# Patient Record
Sex: Female | Born: 1970
Health system: Southern US, Community
[De-identification: ages and names within clinical notes are randomized; demographics above are authoritative.]

## PROBLEM LIST (undated history)

## (undated) ENCOUNTER — Inpatient Hospital Stay (HOSPITAL_COMMUNITY): Payer: Self-pay

## (undated) DIAGNOSIS — J45909 Unspecified asthma, uncomplicated: Secondary | ICD-10-CM

## (undated) DIAGNOSIS — M1990 Unspecified osteoarthritis, unspecified site: Secondary | ICD-10-CM

## (undated) DIAGNOSIS — I10 Essential (primary) hypertension: Secondary | ICD-10-CM

## (undated) DIAGNOSIS — O24419 Gestational diabetes mellitus in pregnancy, unspecified control: Secondary | ICD-10-CM

## (undated) HISTORY — PX: FRACTURE SURGERY: SHX138

---

## 1995-04-07 HISTORY — PX: CHOLECYSTECTOMY, LAPAROSCOPIC: SHX56

## 1995-04-07 HISTORY — PX: ARTHROSCOPIC REPAIR PCL: SUR81

## 1999-12-31 ENCOUNTER — Other Ambulatory Visit: Admission: RE | Admit: 1999-12-31 | Discharge: 1999-12-31 | Payer: Self-pay | Admitting: Family Medicine

## 2001-02-21 ENCOUNTER — Other Ambulatory Visit: Admission: RE | Admit: 2001-02-21 | Discharge: 2001-02-21 | Payer: Self-pay | Admitting: Family Medicine

## 2002-08-11 ENCOUNTER — Other Ambulatory Visit: Admission: RE | Admit: 2002-08-11 | Discharge: 2002-08-11 | Payer: Self-pay | Admitting: Family Medicine

## 2003-09-28 ENCOUNTER — Other Ambulatory Visit: Admission: RE | Admit: 2003-09-28 | Discharge: 2003-09-28 | Payer: Self-pay | Admitting: Family Medicine

## 2004-09-29 ENCOUNTER — Other Ambulatory Visit: Admission: RE | Admit: 2004-09-29 | Discharge: 2004-09-29 | Payer: Self-pay | Admitting: Family Medicine

## 2006-01-15 ENCOUNTER — Other Ambulatory Visit: Admission: RE | Admit: 2006-01-15 | Discharge: 2006-01-15 | Payer: Self-pay | Admitting: Family Medicine

## 2007-01-24 ENCOUNTER — Other Ambulatory Visit: Admission: RE | Admit: 2007-01-24 | Discharge: 2007-01-24 | Payer: Self-pay | Admitting: Family Medicine

## 2007-07-21 ENCOUNTER — Emergency Department (HOSPITAL_COMMUNITY): Admission: EM | Admit: 2007-07-21 | Discharge: 2007-07-21 | Payer: Self-pay | Admitting: Emergency Medicine

## 2007-07-29 ENCOUNTER — Inpatient Hospital Stay (HOSPITAL_COMMUNITY): Admission: RE | Admit: 2007-07-29 | Discharge: 2007-07-30 | Payer: Self-pay | Admitting: Orthopaedic Surgery

## 2008-05-21 ENCOUNTER — Other Ambulatory Visit: Admission: RE | Admit: 2008-05-21 | Discharge: 2008-05-21 | Payer: Self-pay | Admitting: Family Medicine

## 2009-08-14 ENCOUNTER — Other Ambulatory Visit: Admission: RE | Admit: 2009-08-14 | Discharge: 2009-08-14 | Payer: Self-pay | Admitting: Family Medicine

## 2010-08-19 NOTE — Op Note (Signed)
Evelyn [name], Evelyn [name] NO.:  [id number]   MEDICAL RECORD NO.:  [id number]          PATIENT TYPE:  INP   LOCATION:  5034                         FACILITY:  [facility]   PHYSICIAN:  Vanita Panda. Magnus Ivan, M.D.DATE OF BIRTH:  06/22/1970   DATE OF PROCEDURE:  07/29/2007  DATE OF DISCHARGE:                               OPERATIVE REPORT   PREOPERATIVE DIAGNOSES:  1. Left comminuted humerus fracture.  2. Left radial nerve palsy.   POSTOPERATIVE DIAGNOSES:  1. Left comminuted humerus fracture.  2. Left radial nerve palsy.   PROCEDURE:  1. Open reduction and internal fixation of left comminuted humerus      fracture.  2. Left radial nerve exploration and neurolysis.   SURGEON:  Vanita Panda. Magnus Ivan, MD.   ASSISTANT:  Wende Neighbors, PA-C.   ANESTHESIA:  General.   ANTIBIOTICS:  2 g IV Ancef.   BLOOD LOSS:  300 mL.   COMPLICATIONS:  None.   INDICATIONS:  Briefly, Ms. Bartelt is an obese 40 year old female who  is very pleasant individual, who had a mechanical fall in her yard on  July 21, 2007, which was 8 days ago.  She sustained a comminuted  humerus fracture of the humeral shaft.  She was placed in coaptation  splint and was sent as followup to my office since I was on-call.  I did  see the x-rays at the time of her fall, being on-call and per report,  she was neurovascularly intact.  However, I summarized that the  physician on-call or the physician for the emergency room felt that she  was flexing and extending her fingers, but this was likely through the  lumbricals and not through the MCP joints showing a radial nerve injury.  When I examined her this week in the office, she could not extend her  wrist, thumb, or her fingers and she has had numbness in her hand and  definitely a radial nerve injury.  She had a comminuted humerus fracture  which is at least three pieces.  I recommended that she undergo open  reduction and internal fixation of the  humerus with exploration of the  radial nerve.  Risk and benefits of these were explained to her in  length and she agreed to proceed with surgery.   PROCEDURE:  After informed consent was obtained, appropriate left arm  was marked.  She was brought to the operating room and placed supine on  the operating room table.  General anesthesia was then obtained, her  left arm was prepped and draped with Betadine scrub and paint including  a sterile stockinette.  Time-out was called to identify as correct  patient and correct extremity.  I then took an anterolateral approach to  the humerus and dissected down distally between the biceps and the  brachioradialis.  I then carried this extension proximally.  I was able  to find the fractured fragments and the radial nerve intact coming out  of the spiral groove in the muscular septum.  I dissected out of this  area and found the nerve to be intact, however, there was  a large  contusion about the nerve and had significant bruising.  I then  protected nerve and addressed the fracture itself.  There was at least  three large fracture fragments.  I cleaned these off debris and  irrigated the tissues, I then was able to reduce the two proximal  fragments using two lag screws.  I then reduced this using a lag  technique to the distal fragment.  Once this was accomplished, I was  able to choose a Synthes 3.5 mm locking plate with a long plate running  at the length of the humerus.  I tried a 4.5 plate, but her bone was  very small and narrow and even the 4.5 narrow plate was too big for her  humerus.  Once I chose the 3.5 plate, I was able to fashion this with  locking suture proximally and distally and bruising the fracture and  brought this altogether.  I visualized the radial nerve in its entirety.  Again, this nerve was very contused.  I then copiously irrigated the  tissues again and was able to close the deep tissue with 0 Vicryl  followed by 2-0  Vicryl in the subcutaneous tissue and interrupted 3-0  nylon on the skin.  The incision was infiltrated with 0.25% plain  Marcaine.  Xeroform followed by well-padded sterile dressings were  applied.  Her hand remained well perfused throughout the case and again  no tourniquet was used.  All final counts were correct.  There were no  complications noted.  Final blood loss of 300 mL.  Her arm was placed in  a sling.  She was awakened, extubated, and taken to the recovery room in  stable condition.      Vanita Panda. Magnus Ivan, M.D.  Electronically Signed     CYB/MEDQ  D:  07/29/2007  T:  07/30/2007  Job:  [id number]

## 2010-12-30 LAB — BASIC METABOLIC PANEL
CO2: 27
Calcium: 9.5
GFR calc Af Amer: >60
GFR calc non Af Amer: >60
Glucose, Bld: 116 — ABNORMAL HIGH
Potassium: 4.1
Sodium: 137

## 2010-12-30 LAB — CBC
MCHC: 34.4
MCV: 90.3
Platelets: 432 — ABNORMAL HIGH
RBC: 4.19
RDW: 12.6

## 2011-05-12 ENCOUNTER — Other Ambulatory Visit: Payer: Self-pay | Admitting: Gynecology

## 2011-07-08 ENCOUNTER — Other Ambulatory Visit (HOSPITAL_COMMUNITY): Payer: Self-pay | Admitting: Gynecology

## 2011-07-08 DIAGNOSIS — Z3141 Encounter for fertility testing: Secondary | ICD-10-CM

## 2011-07-09 ENCOUNTER — Ambulatory Visit (HOSPITAL_COMMUNITY)
Admission: RE | Admit: 2011-07-09 | Discharge: 2011-07-09 | Disposition: A | Discharge status: Home / Self Care | Payer: 59 | Source: Ambulatory Visit | Attending: Gynecology | Admitting: Gynecology

## 2011-07-09 DIAGNOSIS — Z3141 Encounter for fertility testing: Secondary | ICD-10-CM

## 2011-07-09 DIAGNOSIS — N979 Female infertility, unspecified: Secondary | ICD-10-CM | POA: Insufficient documentation

## 2011-07-09 MED ORDER — IOHEXOL 300 MG/ML  SOLN
10.0000 mL | Freq: Once | INTRAMUSCULAR | Status: AC | PRN
  Administered 2011-07-09: 10 mL

## 2013-03-27 LAB — OB RESULTS CONSOLE RUBELLA ANTIBODY, IGM: Rubella: NON-IMMUNE/NOT IMMUNE

## 2013-03-27 LAB — OB RESULTS CONSOLE GC/CHLAMYDIA
CHLAMYDIA, DNA PROBE: NEGATIVE
GC PROBE AMP, GENITAL: NEGATIVE

## 2013-03-27 LAB — OB RESULTS CONSOLE HEPATITIS B SURFACE ANTIGEN: Hepatitis B Surface Ag: NEGATIVE

## 2013-03-27 LAB — OB RESULTS CONSOLE HIV ANTIBODY (ROUTINE TESTING): HIV: NONREACTIVE

## 2013-03-27 LAB — OB RESULTS CONSOLE ABO/RH: RH Type: POSITIVE

## 2013-03-27 LAB — OB RESULTS CONSOLE ANTIBODY SCREEN: Antibody Screen: NEGATIVE

## 2013-03-27 LAB — OB RESULTS CONSOLE RPR: RPR: NONREACTIVE

## 2013-05-30 ENCOUNTER — Encounter (HOSPITAL_COMMUNITY): Payer: Self-pay | Admitting: *Deleted

## 2013-05-30 ENCOUNTER — Inpatient Hospital Stay (HOSPITAL_COMMUNITY)
Admission: AD | Admit: 2013-05-30 | Discharge: 2013-06-01 | DRG: 781 | Disposition: A | Discharge status: Home / Self Care | Payer: 59 | Source: Ambulatory Visit | Attending: Obstetrics and Gynecology | Admitting: Obstetrics and Gynecology

## 2013-05-30 DIAGNOSIS — O26872 Cervical shortening, second trimester: Secondary | ICD-10-CM | POA: Diagnosis present

## 2013-05-30 DIAGNOSIS — O3430 Maternal care for cervical incompetence, unspecified trimester: Principal | ICD-10-CM | POA: Diagnosis present

## 2013-05-30 DIAGNOSIS — O09529 Supervision of elderly multigravida, unspecified trimester: Secondary | ICD-10-CM | POA: Diagnosis present

## 2013-05-30 DIAGNOSIS — O26879 Cervical shortening, unspecified trimester: Secondary | ICD-10-CM

## 2013-05-30 DIAGNOSIS — O99210 Obesity complicating pregnancy, unspecified trimester: Secondary | ICD-10-CM

## 2013-05-30 DIAGNOSIS — E669 Obesity, unspecified: Secondary | ICD-10-CM | POA: Diagnosis present

## 2013-05-30 DIAGNOSIS — O09819 Supervision of pregnancy resulting from assisted reproductive technology, unspecified trimester: Secondary | ICD-10-CM

## 2013-05-30 DIAGNOSIS — J45909 Unspecified asthma, uncomplicated: Secondary | ICD-10-CM | POA: Diagnosis present

## 2013-05-30 DIAGNOSIS — O10019 Pre-existing essential hypertension complicating pregnancy, unspecified trimester: Secondary | ICD-10-CM | POA: Diagnosis present

## 2013-05-30 HISTORY — DX: Unspecified asthma, uncomplicated: J45.909

## 2013-05-30 HISTORY — DX: Essential (primary) hypertension: I10

## 2013-05-30 HISTORY — DX: Unspecified osteoarthritis, unspecified site: M19.90

## 2013-05-30 LAB — CBC
HEMATOCRIT: 33.2 % — AB (ref 36.0–46.0)
Hemoglobin: 11.5 g/dL — ABNORMAL LOW (ref 12.0–15.0)
MCH: 31.9 pg (ref 26.0–34.0)
MCHC: 34.6 g/dL (ref 30.0–36.0)
MCV: 92.2 fL (ref 78.0–100.0)
PLATELETS: 249 10*3/uL (ref 150–400)
RBC: 3.6 MIL/uL — ABNORMAL LOW (ref 3.87–5.11)
RDW: 13.6 % (ref 11.5–15.5)
WBC: 9.4 10*3/uL (ref 4.0–10.5)

## 2013-05-30 MED ORDER — LABETALOL HCL 100 MG PO TABS
100.0000 mg | ORAL_TABLET | Freq: Four times a day (QID) | ORAL | Status: DC
  Administered 2013-05-30 – 2013-06-01 (×8): 100 mg via ORAL
  Filled 2013-05-30 (×12): qty 1

## 2013-05-30 MED ORDER — CALCIUM CARBONATE ANTACID 500 MG PO CHEW: 2.0000 | CHEWABLE_TABLET | ORAL | Status: DC | PRN

## 2013-05-30 MED ORDER — PRENATAL MULTIVITAMIN CH
1.0000 | ORAL_TABLET | Freq: Every day | ORAL | Status: DC
  Administered 2013-05-31 – 2013-06-01 (×2): 1 via ORAL
  Filled 2013-05-30 (×2): qty 1

## 2013-05-30 MED ORDER — PROGESTERONE MICRONIZED 200 MG PO CAPS
200.0000 mg | ORAL_CAPSULE | Freq: Every day | ORAL | Status: DC
  Administered 2013-05-30 – 2013-05-31 (×2): 200 mg via VAGINAL
  Filled 2013-05-30 (×3): qty 1

## 2013-05-30 MED ORDER — DOCUSATE SODIUM 100 MG PO CAPS
100.0000 mg | ORAL_CAPSULE | Freq: Every day | ORAL | Status: DC
  Administered 2013-05-31 – 2013-06-01 (×2): 100 mg via ORAL
  Filled 2013-05-30 (×2): qty 1

## 2013-05-30 MED ORDER — ZOLPIDEM TARTRATE 5 MG PO TABS
5.0000 mg | ORAL_TABLET | Freq: Every evening | ORAL | Status: DC | PRN
  Administered 2013-05-31 (×2): 5 mg via ORAL
  Filled 2013-05-30 (×2): qty 1

## 2013-05-30 MED ORDER — ACETAMINOPHEN 325 MG PO TABS: 650.0000 mg | ORAL_TABLET | ORAL | Status: DC | PRN

## 2013-05-30 NOTE — H&P (Signed)
Evelyn [name] is a 43 y.o. female presenting for cervical change.  Was seen in the office today by Evelyn [name] and US showed cervical length of 7 mm with funneling.  Vtx female.  Prg complicated by AMA with normal Panorama.  CHTN stable on labetalol.  No risk factors for incompetent cervix.  Admitted for further observation and MFM consult in am. History OB History   Grav Para Term Preterm Abortions TAB SAB Ect Mult Living   2    1  1    0     Past Medical History  Diagnosis Date  . Hypertension   . Arthritis   . Asthma     allergy   Past Surgical History  Procedure Laterality Date  . Fracture surgery      ORIF Humerus  . Cholecystectomy, laparoscopic Bilateral 1997  . Arthroscopic repair pcl Bilateral 1997   Family History: family history includes Arthritis in her mother; Diabetes in her maternal grandmother, mother, and sister; Heart disease in her father and paternal grandmother; Hyperlipidemia in her father, maternal grandfather, maternal grandmother, and sister; Hypertension in her mother and sister; Miscarriages / Stillbirths in her maternal grandmother and mother; Parkinson's disease in her maternal grandmother; Stroke in her maternal grandfather. Social History:  reports that she has never smoked. She has never used smokeless tobacco. She reports that she does not drink alcohol. Her drug history is not on file.   Prenatal Transfer Tool  Maternal Diabetes: No Genetic Screening: Normal Maternal Ultrasounds/Referrals: Abnormal:  Findings:   Other:as above Fetal Ultrasounds or other Referrals:  None Maternal Substance Abuse:  No Significant Maternal Medications:  Meds include: Other: labetalol Significant Maternal Lab Results:  None Other Comments:  None  ROS    Blood pressure 132/74, pulse 97, temperature 98.3 F (36.8 C), temperature source Oral, resp. rate 20, height 5\' 3"  (1.6 m), weight 120.112 kg (264 lb 12.8 oz), last menstrual period 01/03/2013. Exam Physical Exam   Prenatal labs: ABO, Rh:   Antibody:   Rubella:   RPR:    HBsAg:    HIV:    GBS:     Assessment/Plan: IUP at 21 0/7 [facility] Cx shortening.  Plan observation with intermiitant toco, vaginal progesterone and MFM US and consult in am   Evelyn [name] C 05/30/2013, 6:43 PM

## 2013-05-31 ENCOUNTER — Encounter (HOSPITAL_COMMUNITY): Payer: 59 | Admitting: Anesthesiology

## 2013-05-31 ENCOUNTER — Ambulatory Visit (HOSPITAL_COMMUNITY)
Admit: 2013-05-31 | Discharge: 2013-05-31 | Disposition: A | Discharge status: Home / Self Care | Payer: 59 | Attending: Obstetrics and Gynecology | Admitting: Obstetrics and Gynecology

## 2013-05-31 ENCOUNTER — Encounter (HOSPITAL_COMMUNITY)
Admission: AD | Disposition: A | Discharge status: Home / Self Care | Payer: Self-pay | Source: Ambulatory Visit | Attending: Obstetrics and Gynecology

## 2013-05-31 ENCOUNTER — Inpatient Hospital Stay (HOSPITAL_COMMUNITY): Payer: 59

## 2013-05-31 ENCOUNTER — Inpatient Hospital Stay (HOSPITAL_COMMUNITY): Payer: 59 | Admitting: Anesthesiology

## 2013-05-31 HISTORY — PX: CERVICAL CERCLAGE: SHX1329

## 2013-05-31 SURGERY — CERCLAGE, CERVIX, VAGINAL APPROACH
Anesthesia: Spinal | Site: Cervix

## 2013-05-31 MED ORDER — PHENYLEPHRINE 8 MG IN D5W 100 ML (0.08MG/ML) PREMIX OPTIME: INJECTION | INTRAVENOUS | Status: DC | PRN

## 2013-05-31 MED ORDER — PHENYLEPHRINE HCL 10 MG/ML IJ SOLN
INTRAMUSCULAR | Status: DC | PRN
  Administered 2013-05-31: 80 ug via INTRAVENOUS
  Administered 2013-05-31: 120 ug via INTRAVENOUS

## 2013-05-31 MED ORDER — LACTATED RINGERS IV SOLN
INTRAVENOUS | Status: DC | PRN
  Administered 2013-05-31: 13:00:00 via INTRAVENOUS

## 2013-05-31 MED ORDER — CEFAZOLIN SODIUM-DEXTROSE 2-3 GM-% IV SOLR
2.0000 g | Freq: Once | INTRAVENOUS | Status: AC
  Administered 2013-05-31: 2 g via INTRAVENOUS
  Filled 2013-05-31: qty 50

## 2013-05-31 MED ORDER — CEFAZOLIN SODIUM-DEXTROSE 2-3 GM-% IV SOLR
2.0000 g | Freq: Three times a day (TID) | INTRAVENOUS | Status: DC
  Administered 2013-05-31 – 2013-06-01 (×2): 2 g via INTRAVENOUS
  Filled 2013-05-31 (×4): qty 50

## 2013-05-31 MED ORDER — INDOMETHACIN 25 MG SUPPOSITORY
25.0000 mg | Freq: Two times a day (BID) | RECTAL | Status: DC
  Administered 2013-05-31 (×2): 25 mg via RECTAL
  Filled 2013-05-31 (×7): qty 1

## 2013-05-31 MED ORDER — LACTATED RINGERS IV SOLN
INTRAVENOUS | Status: DC
  Administered 2013-05-31: 19:00:00 via INTRAVENOUS

## 2013-05-31 MED ORDER — PHENYLEPHRINE 40 MCG/ML (10ML) SYRINGE FOR IV PUSH (FOR BLOOD PRESSURE SUPPORT)
PREFILLED_SYRINGE | INTRAVENOUS | Status: AC
  Filled 2013-05-31: qty 10

## 2013-05-31 SURGICAL SUPPLY — 18 items
CATH ROBINSON RED A/P 16FR (CATHETERS) ×2 IMPLANT
CLOTH BEACON ORANGE TIMEOUT ST (SAFETY) ×2 IMPLANT
COUNTER NEEDLE 1200 MAGNETIC (NEEDLE) ×2 IMPLANT
GLOVE BIO SURGEON STRL SZ 6.5 (GLOVE) ×2 IMPLANT
GLOVE BIOGEL PI IND STRL 7.0 (GLOVE) ×1 IMPLANT
GLOVE BIOGEL PI INDICATOR 7.0 (GLOVE) ×1
GOWN STRL REUS W/TWL LRG LVL3 (GOWN DISPOSABLE) ×4 IMPLANT
NEEDLE MAYO .5 CIRCLE (NEEDLE) ×2 IMPLANT
PACK VAGINAL MINOR WOMEN LF (CUSTOM PROCEDURE TRAY) ×2 IMPLANT
PAD OB MATERNITY 4.3X12.25 (PERSONAL CARE ITEMS) ×2 IMPLANT
PAD PREP 24X48 CUFFED NSTRL (MISCELLANEOUS) ×2 IMPLANT
SUT ETHIBOND  5 (SUTURE) ×1
SUT ETHIBOND 5 (SUTURE) IMPLANT
TOWEL OR 17X24 6PK STRL BLUE (TOWEL DISPOSABLE) ×4 IMPLANT
TRAY FOLEY CATH 14FR (SET/KITS/TRAYS/PACK) ×1 IMPLANT
TUBING NON-CON 1/4 X 20 CONN (TUBING) ×1 IMPLANT
WATER STERILE IRR 1000ML POUR (IV SOLUTION) ×2 IMPLANT
YANKAUER SUCT BULB TIP NO VENT (SUCTIONS) ×1 IMPLANT

## 2013-05-31 NOTE — Progress Notes (Signed)
Patient is resting status post cerclage performed by Dr. Renaldo FiddlerAdkins. She is without complaint. No vaginal bleeding Continue PG/Indocin Added Ancef Remove foley

## 2013-05-31 NOTE — Anesthesia Postprocedure Evaluation (Signed)
  Anesthesia Post-op Note  Patient: Evelyn [name]  Procedure(s) Performed: Procedure(s): CERCLAGE CERVICAL (N/A)  Patient is awake, responsive, moving her legs, and has signs of resolution of her numbness. Pain and nausea are reasonably well controlled. Vital signs are stable and clinically acceptable. Oxygen saturation is clinically acceptable. There are no apparent anesthetic complications at this time. Patient is ready for discharge.

## 2013-05-31 NOTE — Anesthesia Postprocedure Evaluation (Signed)
  Anesthesia Post-op Note  Patient: Evelyn [name]  Procedure(s) Performed: Procedure(s): CERCLAGE CERVICAL (N/A)  Patient Location: Antenatal  Anesthesia Type:General  Level of Consciousness: awake, alert  and oriented  Airway and Oxygen Therapy: Patient Spontanous Breathing  Post-op Pain: mild  Post-op Assessment: Patient's Cardiovascular Status Stable, Respiratory Function Stable, No signs of Nausea or vomiting and Pain level controlled  Post-op Vital Signs: stable  Complications: No apparent anesthesia complications

## 2013-05-31 NOTE — Consult Note (Signed)
  43 yo G2P0010 with Active SIUP at 7045w1d in gestation complicated by AMA, CHTN, Asthma, and infertility (IVF pregnancy) for MFM consultation due to suspected short cervix  Impressions: 1. History of first trimester missed abortion (no history of cervical insufficiency); 2. Breech fetus; 3. Endovaginal images demonstrate a funneled and short cervix measuring 3-155mm; 4. Sludge is seen in the funnel. 5. Patient meets criteria for ultrasound-indicated cerclage 6. AMA 7. CHTN 8. Infertility 9. IVF pregnancy  I explained to the patient and her husband my diagnosis of cervical insufficiency as based upon ultrasound.  She understands the impression and implications with risk of previable pregnancy loss.  I then discussed available treatments including vaginal progesterone and cerclage.  The patient understood purpose, expected outcomes, risks, and benefits germane to each.  She decided she was interested in cerclage.  Recommendations: 1. I spoke with Dr. Vincente PoliGrewal who will discuss with patient's primary OB Renaldo Fiddler(Adkins) in their practice regarding timing and coverage of the cerclage procedure. 2. I recommend pelvic rest and avoidance of strenuous physical activity 3. Following cerclage I recommend a TVUS for "stitch check" and cervical length in 1 week and then every 2 weeks thereafter until 28 weeks 4. Cerclage removal if evidence infection, labor or achievement of 36 weeks (whichever comes first) 5. Interval growth ultrasounds every 4 weeks given AMA and CHTN 6. Begin antenatal testing at 32 weeks 7. Consider delivery at 38-39 weeks provided hypertension is well-controlled and fetal well-being is reassuring.  Time Spent: I spent in excess of 40 minutes in consultation with this patient to review records, evaluate her case, and provide her with an adequate discussion and education.  More than 50% of this time was spent in direct face-to-face counseling. It was a pleasure seeing your patient in the office  today.  Thank you for consultation. Please do not hesitate to contact our service for any further questions.  I contacted both Ddrs. Rana SnareLowe and Grewal in reference to today's impression and recommendations.  Level III  Thank you,  Louann SjogrenJeffrey Morgan Gaynelle Arabianenney   Denney, Louann SjogrenJeffrey Morgan, MD, MS, FACOG Assistant Professor Section of Maternal-Fetal Medicine [facility]

## 2013-05-31 NOTE — Progress Notes (Signed)
S: Patient just returned from ultrasound  O:  Afebrile VSS General alert and oriented  IMPRESSION: IUP at 21 w 1 days Short cervix  PLAN: Ultrasound results are pending. MFM to consult with patient today after ultrasound reviewed. Continue progesterone.

## 2013-05-31 NOTE — Addendum Note (Signed)
Addendum created 05/31/13 2027 by Elbert Ewingsolleen S Timarion Agcaoili, CRNA   Modules edited: Notes Section   Notes Section:  File: [id number]

## 2013-05-31 NOTE — Progress Notes (Signed)
Pt to surgery for cerclage placement.

## 2013-05-31 NOTE — Transfer of Care (Signed)
Immediate Anesthesia Transfer of Care Note  Patient: Evelyn [name]  Procedure(s) Performed: Procedure(s): CERCLAGE CERVICAL (N/A)  Patient Location: PACU  Anesthesia Type:Spinal  Level of Consciousness: awake, alert  and oriented  Airway & Oxygen Therapy: Patient Spontanous Breathing  Post-op Assessment: Report given to PACU RN and Post -op Vital signs reviewed and stable  Post vital signs: Reviewed and stable  Complications: No apparent anesthesia complications

## 2013-05-31 NOTE — Anesthesia Procedure Notes (Signed)
Spinal  Patient location during procedure: OR Preanesthetic Checklist Completed: patient identified, site marked, surgical consent, pre-op evaluation, timeout performed, IV checked, risks and benefits discussed and monitors and equipment checked Spinal Block Patient position: sitting Prep: DuraPrep Patient monitoring: heart rate, cardiac monitor, continuous pulse ox and blood pressure Approach: midline Location: L4-5 Injection technique: single-shot Needle Needle type: Sprotte  Needle gauge: 24 G Needle length: 9 cm Assessment Sensory level: T8 Additional Notes Bupiv .75% 1.4 cc   Sprotte through [employer]

## 2013-05-31 NOTE — Progress Notes (Signed)
Patient to MFM for US and consult

## 2013-05-31 NOTE — Progress Notes (Signed)
I spoke to Dr. Katherina Rightenny today who recommended the cerclage given cervix measures shorter today. Ate  At 0900 but given current situation I feel medically necessary to proceed with cerclage at 1230 given urgent nature of situation. Risks of procedure - fetal loss, infection, bleeding, rupture of membranes, or failure of procedure resulting in loss of pregnancy discussed with patient. Consent signed. Dr. Renaldo FiddlerAdkins to perform procedure at 1230.

## 2013-05-31 NOTE — Anesthesia Preprocedure Evaluation (Addendum)
Anesthesia Evaluation  Patient identified by MRN, date of birth, ID band Patient awake    Reviewed: Allergy & Precautions, H&P , Patient's Chart, lab work & pertinent test results  Airway Mallampati: II TM Distance: >3 FB Neck ROM: full    Dental no notable dental hx.    Pulmonary  breath sounds clear to auscultation  Pulmonary exam normal       Cardiovascular Exercise Tolerance: Good hypertension, Rhythm:regular Rate:Normal     Neuro/Psych    GI/Hepatic   Endo/Other  Morbid obesity  Renal/GU      Musculoskeletal   Abdominal   Peds  Hematology   Anesthesia Other Findings   Reproductive/Obstetrics                           Anesthesia Physical Anesthesia Plan  ASA: III and emergent  Anesthesia Plan: Spinal   Post-op Pain Management:    Induction:   Airway Management Planned:   Additional Equipment:   Intra-op Plan:   Post-operative Plan:   Informed Consent: I have reviewed the patients History and Physical, chart, labs and discussed the procedure including the risks, benefits and alternatives for the proposed anesthesia with the patient or authorized representative who has indicated his/her understanding and acceptance.   Dental Advisory Given  Plan Discussed with: CRNA  Anesthesia Plan Comments: (Lab work confirmed with CRNA in room. Platelets okay. Discussed spinal anesthetic, and patient consents to the procedure:  included risk of possible headache,backache, failed block, allergic reaction, and nerve injury. This patient was asked if she had any questions or concerns before the procedure started. )        Anesthesia Quick Evaluation

## 2013-05-31 NOTE — Progress Notes (Signed)
Ur chart review completed.  

## 2013-06-01 ENCOUNTER — Encounter (HOSPITAL_COMMUNITY): Payer: Self-pay | Admitting: Obstetrics and Gynecology

## 2013-06-01 ENCOUNTER — Inpatient Hospital Stay (HOSPITAL_COMMUNITY): Payer: 59

## 2013-06-01 MED ORDER — PROGESTERONE MICRONIZED 200 MG PO CAPS: 200.0000 mg | ORAL_CAPSULE | Freq: Every day | ORAL | Status: DC

## 2013-06-01 MED ORDER — INDOMETHACIN 25 MG PO CAPS: 25.0000 mg | ORAL_CAPSULE | Freq: Two times a day (BID) | ORAL | Status: DC

## 2013-06-01 MED ORDER — DSS 100 MG PO CAPS: 100.0000 mg | ORAL_CAPSULE | Freq: Two times a day (BID) | ORAL | Status: AC

## 2013-06-01 MED ORDER — INDOMETHACIN 25 MG PO CAPS
25.0000 mg | ORAL_CAPSULE | Freq: Two times a day (BID) | ORAL | Status: DC
  Administered 2013-06-01: 25 mg via ORAL
  Filled 2013-06-01 (×3): qty 1

## 2013-06-01 NOTE — Progress Notes (Signed)
Pt. Is stable and ready to be discharged home. All discharge instructions reviewed and prescriptions reviewed and given to patient. All belongings with the patient. Pt. Wheeled out via wheelchair with husband. Pt. Understands to stay on bedrest until follow up appointment

## 2013-06-01 NOTE — Progress Notes (Signed)
No complaints.  No postop pain.  No vaginal bleeding.  VSS.  AF. Gen: A&O x 3 Abd: soft, NT Ext: no c/c/e  42yo G2P0010 at 4815w2d with cervical insufficiency s/p cerclage placement yesterday. -Plan for post-cerclage cervical length with MFM -Continue indocin course -Continue abx -Continue vaginal progesterone -Assess for outpt management with MFM recs   MM

## 2013-06-01 NOTE — Op Note (Signed)
[name]:  [name], Evelyn              ACCOUNT NO.:  [id number]  MEDICAL RECORD NO.:  [id number]  LOCATION:  9157                          FACILITY:  [facility]  PHYSICIAN:  Zelphia CairoGretchen Million Maharaj, MD    DATE OF BIRTH:  04/10/1970  DATE OF PROCEDURE:  05/31/2013 DATE OF DISCHARGE:                              OPERATIVE REPORT   PREOPERATIVE DIAGNOSIS:  Cervical insufficiency.  POSTOPERATIVE DIAGNOSIS:  Cervical insufficiency.  PROCEDURE:  A rescue single McDonald cerclage.  SURGEON:  Zelphia CairoGretchen Kimbly Eanes, MD.  ASSISTANStann Mainland:  Michelle L. Grewal, M.D.  BLOOD LOSS:  Less than 100 mL.  COMPLICATIONS:  None.  ANESTHESIA:  Spinal.  CONDITION:  Stable to recovery room.  PROCEDURE:  The patient was taken to the operating room.  After informed consent was obtained, she was given spinal anesthesia, prepped and draped in sterile fashion and a Foley catheter was inserted sterilely. Weighted speculum was placed in the posterior vagina.  A Deaver was placed anteriorly and laterally.  Bilateral labia majora were sutured to the mons pubis to allow better visualization.  It was very difficult to visualize the cervical os.  The cervix appeared flushed with the vaginal cuff.  Upon palpation digitally only a small area of cervix could be identified.  Anteriorly, the cervix was grasped with ring forceps, and #5 Ethibond was used to perform a pursestring suture around the external os of the cervix.  Very little cervix could be identified for suture placement.  Suture was tied at 12 o'clock position and air knot was then tied to allow for visualization at the time of removal.  The cervix was hemostatic.  No bleeding or leakage of fluid was identified after surgery.  She tolerated the procedure well.  She was taken to the PACU in stable condition.  Sponge, lap, needle, and instrument counts were correct x2.     Zelphia CairoGretchen Seher Schlagel, MD    GA/MEDQ  D:  05/31/2013  T:  06/01/2013  Job:  [id number]

## 2013-06-01 NOTE — Discharge Summary (Signed)
Obstetric Discharge Summary Reason for Admission: cervical incomp Prenatal Procedures: cerclage  Intrapartum Procedures: cervical cerclage Postpartum Procedures: n/a Complications-Operative and Postpartum: none Hemoglobin  Date Value Ref Range Status  05/30/2013 11.5* 12.0 - 15.0 g/dL Final     HCT  Date Value Ref Range Status  05/30/2013 33.2* 36.0 - 46.0 % Final    Physical Exam:  General: alert, cooperative and appears stated age 13Lochia: n/a Uterine Fundus: soft Incision: n/a DVT Evaluation: No evidence of DVT seen on physical exam.  Discharge Diagnoses: Incompetent cervix  Discharge Information: Date: 06/01/2013 Activity: pelvic rest Diet: routine Medications: PNV and prometrium Condition: stable Instructions: refer to practice specific booklet Discharge to: home   Newborn Data: <<This patient has not yet delivered during this pregnancy.>> Home with n/a.  Evelyn [name] 06/01/2013, 3:53 PM

## 2013-06-01 NOTE — Progress Notes (Signed)
Reviewed images with Dr. Sherrie Georgeecker who called CL normal and measuring greater than 3cm.  The area of previous concern seems to be abnormal appearing secondary to cervical mucous.    Since the patient has a normal cervical length and is pre-viable, will discharge home with follow-up in the office Tuesday.  MM

## 2013-06-01 NOTE — Discharge Instructions (Signed)
Call office to schedule appointment and ultrasound for cervical length on Tuesday.  Bedrest at home.

## 2013-06-21 ENCOUNTER — Inpatient Hospital Stay (HOSPITAL_COMMUNITY)
Admission: AD | Admit: 2013-06-21 | Discharge: 2013-06-21 | Disposition: A | Discharge status: Home / Self Care | Payer: 59 | Source: Ambulatory Visit | Attending: Obstetrics and Gynecology | Admitting: Obstetrics and Gynecology

## 2013-06-21 DIAGNOSIS — O4700 False labor before 37 completed weeks of gestation, unspecified trimester: Secondary | ICD-10-CM | POA: Insufficient documentation

## 2013-06-21 MED ORDER — BETAMETHASONE SOD PHOS & ACET 6 (3-3) MG/ML IJ SUSP
12.0000 mg | INTRAMUSCULAR | Status: DC
  Administered 2013-06-21: 12 mg via INTRAMUSCULAR
  Filled 2013-06-21: qty 2

## 2013-06-21 NOTE — MAU Note (Signed)
Pt here for steroid injection. 

## 2013-06-22 ENCOUNTER — Inpatient Hospital Stay (HOSPITAL_COMMUNITY)
Admission: AD | Admit: 2013-06-22 | Discharge: 2013-06-22 | Disposition: A | Discharge status: Home / Self Care | Payer: 59 | Source: Ambulatory Visit | Attending: Obstetrics and Gynecology | Admitting: Obstetrics and Gynecology

## 2013-06-22 DIAGNOSIS — O4700 False labor before 37 completed weeks of gestation, unspecified trimester: Secondary | ICD-10-CM | POA: Insufficient documentation

## 2013-06-22 MED ORDER — BETAMETHASONE SOD PHOS & ACET 6 (3-3) MG/ML IJ SUSP
12.0000 mg | Freq: Once | INTRAMUSCULAR | Status: AC
  Administered 2013-06-22: 12 mg via INTRAMUSCULAR
  Filled 2013-06-22: qty 2

## 2013-06-22 NOTE — MAU Note (Signed)
Pt here for second betamethasone injection. 

## 2013-07-27 ENCOUNTER — Inpatient Hospital Stay (HOSPITAL_COMMUNITY)
Admission: AD | Admit: 2013-07-27 | Discharge: 2013-07-27 | Disposition: A | Discharge status: Home / Self Care | Payer: 59 | Source: Ambulatory Visit | Attending: Obstetrics & Gynecology | Admitting: Obstetrics & Gynecology

## 2013-07-27 DIAGNOSIS — O4700 False labor before 37 completed weeks of gestation, unspecified trimester: Secondary | ICD-10-CM | POA: Insufficient documentation

## 2013-07-27 MED ORDER — BETAMETHASONE SOD PHOS & ACET 6 (3-3) MG/ML IJ SUSP
12.0000 mg | Freq: Once | INTRAMUSCULAR | Status: AC
  Administered 2013-07-27: 12 mg via INTRAMUSCULAR
  Filled 2013-07-27: qty 2

## 2013-07-27 NOTE — ED Notes (Signed)
Pt here for Betamethasone injection. instructed to come back tomorrow for 2nd.

## 2013-07-28 ENCOUNTER — Inpatient Hospital Stay (HOSPITAL_COMMUNITY)
Admission: AD | Admit: 2013-07-28 | Discharge: 2013-07-28 | Disposition: A | Discharge status: Home / Self Care | Payer: 59 | Source: Ambulatory Visit | Attending: Obstetrics and Gynecology | Admitting: Obstetrics and Gynecology

## 2013-07-28 DIAGNOSIS — O4700 False labor before 37 completed weeks of gestation, unspecified trimester: Secondary | ICD-10-CM | POA: Insufficient documentation

## 2013-07-28 MED ORDER — BETAMETHASONE SOD PHOS & ACET 6 (3-3) MG/ML IJ SUSP
12.0000 mg | Freq: Once | INTRAMUSCULAR | Status: AC
  Administered 2013-07-28: 12 mg via INTRAMUSCULAR
  Filled 2013-07-28: qty 2

## 2013-09-15 LAB — OB RESULTS CONSOLE GBS: GBS: POSITIVE

## 2013-09-22 ENCOUNTER — Inpatient Hospital Stay (HOSPITAL_COMMUNITY)
Admission: AD | Admit: 2013-09-22 | Discharge: 2013-09-27 | DRG: 765 | Disposition: A | Discharge status: Home / Self Care | Payer: 59 | Source: Ambulatory Visit | Attending: Obstetrics and Gynecology | Admitting: Obstetrics and Gynecology

## 2013-09-22 ENCOUNTER — Encounter (HOSPITAL_COMMUNITY): Payer: Self-pay | Admitting: *Deleted

## 2013-09-22 DIAGNOSIS — O99214 Obesity complicating childbirth: Secondary | ICD-10-CM

## 2013-09-22 DIAGNOSIS — O169 Unspecified maternal hypertension, unspecified trimester: Secondary | ICD-10-CM | POA: Diagnosis present

## 2013-09-22 DIAGNOSIS — O99892 Other specified diseases and conditions complicating childbirth: Secondary | ICD-10-CM | POA: Diagnosis present

## 2013-09-22 DIAGNOSIS — Z8249 Family history of ischemic heart disease and other diseases of the circulatory system: Secondary | ICD-10-CM

## 2013-09-22 DIAGNOSIS — E669 Obesity, unspecified: Secondary | ICD-10-CM | POA: Diagnosis present

## 2013-09-22 DIAGNOSIS — L02219 Cutaneous abscess of trunk, unspecified: Secondary | ICD-10-CM | POA: Diagnosis not present

## 2013-09-22 DIAGNOSIS — O9989 Other specified diseases and conditions complicating pregnancy, childbirth and the puerperium: Secondary | ICD-10-CM

## 2013-09-22 DIAGNOSIS — Z833 Family history of diabetes mellitus: Secondary | ICD-10-CM

## 2013-09-22 DIAGNOSIS — O09529 Supervision of elderly multigravida, unspecified trimester: Secondary | ICD-10-CM | POA: Diagnosis present

## 2013-09-22 DIAGNOSIS — Z6841 Body Mass Index (BMI) 40.0 and over, adult: Secondary | ICD-10-CM

## 2013-09-22 DIAGNOSIS — L03319 Cellulitis of trunk, unspecified: Secondary | ICD-10-CM

## 2013-09-22 DIAGNOSIS — O909 Complication of the puerperium, unspecified: Secondary | ICD-10-CM | POA: Diagnosis not present

## 2013-09-22 DIAGNOSIS — Z823 Family history of stroke: Secondary | ICD-10-CM

## 2013-09-22 DIAGNOSIS — O99814 Abnormal glucose complicating childbirth: Secondary | ICD-10-CM | POA: Diagnosis present

## 2013-09-22 DIAGNOSIS — Z22330 Carrier of Group B streptococcus: Secondary | ICD-10-CM

## 2013-09-22 DIAGNOSIS — O1002 Pre-existing essential hypertension complicating childbirth: Principal | ICD-10-CM | POA: Diagnosis present

## 2013-09-22 HISTORY — DX: Gestational diabetes mellitus in pregnancy, unspecified control: O24.419

## 2013-09-22 LAB — COMPREHENSIVE METABOLIC PANEL
ALBUMIN: 2.4 g/dL — AB (ref 3.5–5.2)
ALK PHOS: 95 U/L (ref 39–117)
ALK PHOS: 84 U/L (ref 39–117)
ALT: 17 U/L (ref 0–35)
ALT: 17 U/L (ref 0–35)
AST: 21 U/L (ref 0–37)
AST: 20 U/L (ref 0–37)
Albumin: 2.6 g/dL — ABNORMAL LOW (ref 3.5–5.2)
BILIRUBIN TOTAL: 0.3 mg/dL (ref 0.3–1.2)
BILIRUBIN TOTAL: 0.3 mg/dL (ref 0.3–1.2)
BUN: 5 mg/dL — AB (ref 6–23)
BUN: 4 mg/dL — ABNORMAL LOW (ref 6–23)
CHLORIDE: 104 meq/L (ref 96–112)
CHLORIDE: 102 meq/L (ref 96–112)
CO2: 21 meq/L (ref 19–32)
CO2: 23 mEq/L (ref 19–32)
Calcium: 9.3 mg/dL (ref 8.4–10.5)
Calcium: 8.9 mg/dL (ref 8.4–10.5)
Creatinine, Ser: 0.42 mg/dL — ABNORMAL LOW (ref 0.50–1.10)
Creatinine, Ser: 0.46 mg/dL — ABNORMAL LOW (ref 0.50–1.10)
GFR calc Af Amer: >90 mL/min (ref >90)
GFR calc non Af Amer: >90 mL/min (ref >90)
GLUCOSE: 74 mg/dL (ref 70–99)
Glucose, Bld: 102 mg/dL — ABNORMAL HIGH (ref 70–99)
POTASSIUM: 3.7 meq/L (ref 3.7–5.3)
POTASSIUM: 3.6 meq/L — AB (ref 3.7–5.3)
Sodium: 140 mEq/L (ref 137–147)
Sodium: 139 mEq/L (ref 137–147)
TOTAL PROTEIN: 6 g/dL (ref 6.0–8.3)
Total Protein: 5.7 g/dL — ABNORMAL LOW (ref 6.0–8.3)

## 2013-09-22 LAB — CBC
HCT: 35.4 % — ABNORMAL LOW (ref 36.0–46.0)
HEMATOCRIT: 36.7 % (ref 36.0–46.0)
HEMOGLOBIN: 12.5 g/dL (ref 12.0–15.0)
Hemoglobin: 11.9 g/dL — ABNORMAL LOW (ref 12.0–15.0)
MCH: 32.6 pg (ref 26.0–34.0)
MCH: 32.4 pg (ref 26.0–34.0)
MCHC: 34.1 g/dL (ref 30.0–36.0)
MCHC: 33.6 g/dL (ref 30.0–36.0)
MCV: 95.8 fL (ref 78.0–100.0)
MCV: 96.5 fL (ref 78.0–100.0)
PLATELETS: 257 10*3/uL (ref 150–400)
Platelets: 258 10*3/uL (ref 150–400)
RBC: 3.83 MIL/uL — AB (ref 3.87–5.11)
RBC: 3.67 MIL/uL — ABNORMAL LOW (ref 3.87–5.11)
RDW: 15.2 % (ref 11.5–15.5)
RDW: 15.3 % (ref 11.5–15.5)
WBC: 9.7 10*3/uL (ref 4.0–10.5)
WBC: 9.4 10*3/uL (ref 4.0–10.5)

## 2013-09-22 LAB — PROTEIN / CREATININE RATIO, URINE
CREATININE, URINE: 74.26 mg/dL
Protein Creatinine Ratio: 0.23 — ABNORMAL HIGH (ref 0.00–0.15)
Total Protein, Urine: 17.4 mg/dL

## 2013-09-22 LAB — ABO/RH: ABO/RH(D): A POS

## 2013-09-22 LAB — GLUCOSE, CAPILLARY
GLUCOSE-CAPILLARY: 98 mg/dL (ref 70–99)
Glucose-Capillary: 98 mg/dL (ref 70–99)
Glucose-Capillary: 65 mg/dL — ABNORMAL LOW (ref 70–99)

## 2013-09-22 LAB — TYPE AND SCREEN
ABO/RH(D): A POS
ANTIBODY SCREEN: NEGATIVE

## 2013-09-22 LAB — URIC ACID
URIC ACID, SERUM: 4.4 mg/dL (ref 2.4–7.0)
Uric Acid, Serum: 4.4 mg/dL (ref 2.4–7.0)

## 2013-09-22 MED ORDER — DIPHENHYDRAMINE HCL 50 MG/ML IJ SOLN: 12.5000 mg | INTRAMUSCULAR | Status: DC | PRN

## 2013-09-22 MED ORDER — LIDOCAINE HCL (PF) 1 % IJ SOLN: 30.0000 mL | INTRAMUSCULAR | Status: DC | PRN

## 2013-09-22 MED ORDER — LABETALOL HCL 100 MG PO TABS
100.0000 mg | ORAL_TABLET | Freq: Four times a day (QID) | ORAL | Status: DC
  Administered 2013-09-22 – 2013-09-23 (×3): 100 mg via ORAL
  Filled 2013-09-22 (×8): qty 1

## 2013-09-22 MED ORDER — EPHEDRINE 5 MG/ML INJ: 10.0000 mg | INTRAVENOUS | Status: DC | PRN

## 2013-09-22 MED ORDER — BUTORPHANOL TARTRATE 1 MG/ML IJ SOLN
1.0000 mg | INTRAMUSCULAR | Status: DC | PRN
  Administered 2013-09-23: 1 mg via INTRAVENOUS
  Filled 2013-09-22: qty 1

## 2013-09-22 MED ORDER — FLEET ENEMA 7-19 GM/118ML RE ENEM: 1.0000 | ENEMA | RECTAL | Status: DC | PRN

## 2013-09-22 MED ORDER — MISOPROSTOL 25 MCG QUARTER TABLET
25.0000 ug | ORAL_TABLET | ORAL | Status: DC | PRN
  Administered 2013-09-22 – 2013-09-23 (×2): 25 ug via VAGINAL
  Filled 2013-09-22 (×2): qty 0.25

## 2013-09-22 MED ORDER — ONDANSETRON HCL 4 MG/2ML IJ SOLN: 4.0000 mg | Freq: Four times a day (QID) | INTRAMUSCULAR | Status: DC | PRN

## 2013-09-22 MED ORDER — OXYTOCIN 40 UNITS IN LACTATED RINGERS INFUSION - SIMPLE MED: 62.5000 mL/h | INTRAVENOUS | Status: DC

## 2013-09-22 MED ORDER — CITRIC ACID-SODIUM CITRATE 334-500 MG/5ML PO SOLN
30.0000 mL | ORAL | Status: DC | PRN
  Administered 2013-09-22 – 2013-09-23 (×2): 30 mL via ORAL
  Filled 2013-09-22 (×2): qty 15

## 2013-09-22 MED ORDER — LACTATED RINGERS IV SOLN: 500.0000 mL | Freq: Once | INTRAVENOUS | Status: DC

## 2013-09-22 MED ORDER — TERBUTALINE SULFATE 1 MG/ML IJ SOLN: 0.2500 mg | Freq: Once | INTRAMUSCULAR | Status: AC | PRN

## 2013-09-22 MED ORDER — OXYTOCIN 40 UNITS IN LACTATED RINGERS INFUSION - SIMPLE MED
1.0000 m[IU]/min | INTRAVENOUS | Status: DC
  Administered 2013-09-22: 2 m[IU]/min via INTRAVENOUS
  Filled 2013-09-22: qty 1000

## 2013-09-22 MED ORDER — ALBUTEROL SULFATE (2.5 MG/3ML) 0.083% IN NEBU: 2.5000 mg | INHALATION_SOLUTION | Freq: Four times a day (QID) | RESPIRATORY_TRACT | Status: DC | PRN

## 2013-09-22 MED ORDER — IBUPROFEN 600 MG PO TABS: 600.0000 mg | ORAL_TABLET | Freq: Four times a day (QID) | ORAL | Status: DC | PRN

## 2013-09-22 MED ORDER — FENTANYL 2.5 MCG/ML BUPIVACAINE 1/10 % EPIDURAL INFUSION (WH - ANES)
14.0000 mL/h | INTRAMUSCULAR | Status: DC | PRN
  Administered 2013-09-23: 14 mL/h via EPIDURAL

## 2013-09-22 MED ORDER — LACTATED RINGERS IV SOLN: 500.0000 mL | INTRAVENOUS | Status: DC | PRN

## 2013-09-22 MED ORDER — HYDROXYZINE HCL 50 MG PO TABS: 50.0000 mg | ORAL_TABLET | Freq: Four times a day (QID) | ORAL | Status: DC | PRN

## 2013-09-22 MED ORDER — PHENYLEPHRINE 40 MCG/ML (10ML) SYRINGE FOR IV PUSH (FOR BLOOD PRESSURE SUPPORT)
80.0000 ug | PREFILLED_SYRINGE | INTRAVENOUS | Status: DC | PRN
Start: 2013-09-22 — End: 2013-09-23

## 2013-09-22 MED ORDER — OXYTOCIN BOLUS FROM INFUSION: 500.0000 mL | INTRAVENOUS | Status: DC

## 2013-09-22 MED ORDER — ACETAMINOPHEN 325 MG PO TABS: 650.0000 mg | ORAL_TABLET | ORAL | Status: DC | PRN

## 2013-09-22 MED ORDER — PHENYLEPHRINE 40 MCG/ML (10ML) SYRINGE FOR IV PUSH (FOR BLOOD PRESSURE SUPPORT): 80.0000 ug | PREFILLED_SYRINGE | INTRAVENOUS | Status: DC | PRN

## 2013-09-22 MED ORDER — LACTATED RINGERS IV SOLN
INTRAVENOUS | Status: DC
  Administered 2013-09-22 – 2013-09-23 (×7): via INTRAVENOUS

## 2013-09-22 NOTE — Progress Notes (Signed)
FHT good accels UCs irregular Cx 1-2/very posterior/unable to feel presenting part Bedside U/S  = vertex Pitocin running

## 2013-09-22 NOTE — Progress Notes (Signed)
Pitocin running, no monitored or perceived contractions FHT reactive  BPs 150-160s/80-90s  A/P: Stop pitocin         Begin two stage induction-D/W risks

## 2013-09-22 NOTE — H&P (Signed)
Evelyn [name] is a 43 y.o. female presenting for evaluation of elevated BP in office. Pregnancy complicated by Evelyn [name] on labetalol and GDM on glyburide. Her BPs are increasingly labile with BPs of 180s over 90s-100s in office. Fasting glucose mostly under 100. Denies HA and epigastric pain, no blurry vision, no ROM. Cervix 1.5/thin/-1/vertex per Dr Evelyn [name] in office today. Maternal Medical History:  Fetal activity: Perceived fetal activity is normal.      OB History   Grav Para Term Preterm Abortions TAB SAB Ect Mult Living   2    1  1    0     Past Medical History  Diagnosis Date  . Hypertension   . Arthritis   . Asthma     allergy  . Gestational diabetes    Past Surgical History  Procedure Laterality Date  . Fracture surgery      ORIF Humerus  . Cholecystectomy, laparoscopic Bilateral 1997  . Arthroscopic repair pcl Bilateral 1997  . Cervical cerclage N/A 05/31/2013    Procedure: CERCLAGE CERVICAL;  Surgeon: Evelyn CairoGretchen Adkins, MD;  Location: [facility];  Service: Gynecology;  Laterality: N/A;   Family History: family history includes Arthritis in her mother; Diabetes in her maternal grandmother, mother, and sister; Heart disease in her father and paternal grandmother; Hyperlipidemia in her father, maternal grandfather, maternal grandmother, and sister; Hypertension in her mother and sister; Miscarriages / Stillbirths in her maternal grandmother and mother; Parkinson's disease in her maternal grandmother; Stroke in her maternal grandfather. Social History:  reports that she has never smoked. She has never used smokeless tobacco. She reports that she does not drink alcohol. Her drug history is not on file.   Prenatal Transfer Tool  Maternal Diabetes: Yes:  Diabetes Type:  Insulin/Medication controlled Genetic Screening: Normal Maternal Ultrasounds/Referrals: Normal Fetal Ultrasounds or other Referrals:  None Maternal Substance Abuse:  No Significant Maternal Medications:  Meds include:  Other: labetalol, glyburide Significant Maternal Lab Results:  Lab values include: Group B Strep negative Other Comments:  None  Review of Systems  Eyes: Negative for blurred vision.  Gastrointestinal: Negative for abdominal pain.  Neurological: Negative for headaches.      Blood pressure 147/61, pulse 88, temperature 98.8 F (37.1 C), temperature source Oral, resp. rate 18, height 5\' 1"  (1.549 m), weight 132.178 kg (291 lb 6.4 oz), last menstrual period 01/03/2013, SpO2 98.00%. Maternal Exam:  Uterine Assessment: Contraction strength is mild.  Contraction frequency is irregular.   Abdomen: Fetal presentation: vertex     Fetal Exam Fetal State Assessment: Category I - tracings are normal.     Physical Exam  Cardiovascular: Normal rate and regular rhythm.   Respiratory: Effort normal.  GI: Soft. There is no tenderness.  Neurological: She has normal reflexes.    Results for orders placed during the hospital encounter of 09/22/13 (from the past 24 hour(s))  PROTEIN / CREATININE RATIO, URINE     Status: Abnormal   Collection Time    09/22/13  2:25 PM      Result Value Ref Range   Creatinine, Urine 74.26     Total Protein, Urine 17.4     PROTEIN CREATININE RATIO 0.23 (*) 0.00 - 0.15  CBC     Status: Abnormal   Collection Time    09/22/13  3:05 PM      Result Value Ref Range   WBC 9.7  4.0 - 10.5 K/uL   RBC 3.83 (*) 3.87 - 5.11 MIL/uL   Hemoglobin 12.5  12.0 -  15.0 g/dL   HCT 78.236.7  95.636.0 - 21.346.0 %   MCV 95.8  78.0 - 100.0 fL   MCH 32.6  26.0 - 34.0 pg   MCHC 34.1  30.0 - 36.0 g/dL   RDW 08.615.2  57.811.5 - 46.915.5 %   Platelets 258  150 - 400 K/uL  COMPREHENSIVE METABOLIC PANEL     Status: Abnormal   Collection Time    09/22/13  3:05 PM      Result Value Ref Range   Sodium 140  137 - 147 mEq/L   Potassium 3.7  3.7 - 5.3 mEq/L   Chloride 104  96 - 112 mEq/L   CO2 21  19 - 32 mEq/L   Glucose, Bld 74  70 - 99 mg/dL   BUN 5 (*) 6 - 23 mg/dL   Creatinine, Ser 6.290.42 (*) 0.50 -  1.10 mg/dL   Calcium 9.3  8.4 - 52.810.5 mg/dL   Total Protein 6.0  6.0 - 8.3 g/dL   Albumin 2.6 (*) 3.5 - 5.2 g/dL   AST 21  0 - 37 U/L   ALT 17  0 - 35 U/L   Alkaline Phosphatase 95  39 - 117 U/L   Total Bilirubin 0.3  0.3 - 1.2 mg/dL   GFR calc non Af Amer >90  >90 mL/min   GFR calc Af Amer >90  >90 mL/min  URIC ACID     Status: None   Collection Time    09/22/13  3:05 PM      Result Value Ref Range   Uric Acid, Serum 4.4  2.4 - 7.0 mg/dL    Prenatal labs: ABO, Rh:   Antibody:   Rubella:   RPR:    HBsAg:    HIV:    GBS:     Assessment/Plan: 43 yo G2P0 at 1337 4/7 weeks [name] and increasingly labile BPs at 37 3/7 weeks and GDM on glyburide D/W patient and husband-recommend delivery D/W risks-failed induction, fetal lung immaturity.   Evelyn [name] 09/22/2013, 4:17 PM

## 2013-09-22 NOTE — MAU Provider Note (Signed)
History     CSN: [id number]  Arrival date and time: 09/22/13 1334   First Provider Initiated Contact with Patient 09/22/13 1553      Chief Complaint  Patient presents with  . Hypertension   HPI  Evelyn [name] is a 43 y.o. G2P0010 at 8041w3d who was sent over from the office for elevated blood pressures. She states that she has chronic hypertension, and take labetalol QID. When she is not pregnant she takes is BID. She states that she has been taking the labetalol for "years". She denies any HA, visual disturbances or epigastric pain.   Past Medical History  Diagnosis Date  . Hypertension   . Arthritis   . Asthma     allergy  . Gestational diabetes     Past Surgical History  Procedure Laterality Date  . Fracture surgery      ORIF Humerus  . Cholecystectomy, laparoscopic Bilateral 1997  . Arthroscopic repair pcl Bilateral 1997  . Cervical cerclage N/A 05/31/2013    Procedure: CERCLAGE CERVICAL;  Surgeon: Zelphia CairoGretchen Adkins, MD;  Location: [facility];  Service: Gynecology;  Laterality: N/A;    Family History  Problem Relation Age of Onset  . Arthritis Mother   . Diabetes Mother   . Hypertension Mother   . Miscarriages / [place] Mother   . Heart disease Father   . Hyperlipidemia Father   . Diabetes Sister   . Hyperlipidemia Sister   . Hypertension Sister   . Diabetes Maternal Grandmother   . Hyperlipidemia Maternal Grandmother   . Miscarriages / Stillbirths Maternal Grandmother   . Parkinson's disease Maternal Grandmother   . Hyperlipidemia Maternal Grandfather   . Stroke Maternal Grandfather   . Heart disease Paternal Grandmother     History  Substance Use Topics  . Smoking status: Never Smoker   . Smokeless tobacco: Never Used  . Alcohol Use: No     Comment: occ. none with preg    Allergies:  Allergies  Allergen Reactions  . Codeine Itching and Other (See Comments)    hallucinations    Prescriptions prior to admission  Medication Sig Dispense Refill   . acetaminophen (TYLENOL) 325 MG tablet Take 325 mg by mouth every 6 (six) hours as needed for mild pain.      . calcium carbonate (TUMS - DOSED IN MG ELEMENTAL CALCIUM) 500 MG chewable tablet Chew 2 tablets by mouth daily as needed for indigestion or heartburn.      . cholecalciferol (VITAMIN D) 1000 UNITS tablet Take 3,000 Units by mouth daily.      . Docosahexaenoic Acid (DHA PO) Take 1 tablet by mouth daily.      Marland Kitchen. docusate sodium 100 MG CAPS Take 100 mg by mouth 2 (two) times daily.  60 capsule  2  . labetalol (NORMODYNE) 100 MG tablet Take 1 tablet by mouth 4 (four) times daily.       . Prenatal Vit-Fe Fumarate-FA (PRENATAL MULTIVITAMIN) TABS tablet Take 1 tablet by mouth daily at 12 noon.      . progesterone (PROMETRIUM) 200 MG capsule Place 1 capsule (200 mg total) vaginally at bedtime.  30 capsule  3  . vitamin C (ASCORBIC ACID) 250 MG tablet Take 500 mg by mouth daily.        ROS Physical Exam   Blood pressure 150/73, pulse 95, temperature 98.8 F (37.1 C), temperature source Oral, resp. rate 18, height 5\' 1"  (1.549 m), weight 132.178 kg (291 lb 6.4 oz), last menstrual period  01/03/2013, SpO2 98.00%.  Physical Exam  Nursing note and vitals reviewed. Constitutional: She is oriented to person, place, and time. She appears well-developed and well-nourished. No distress.  Cardiovascular: Normal rate.   Respiratory: Effort normal.  GI: Soft. There is no tenderness.  Neurological: She is alert and oriented to person, place, and time. She has normal reflexes.  Skin: Skin is warm and dry.  Psychiatric: She has a normal mood and affect.   FHT 130, moderate with 15x15 accels, no decels, intermittently tracing 2/2 fetal movement.  Toco: No UCs MAU Course  Procedures  Results for orders placed during the hospital encounter of 09/22/13 (from the past 24 hour(s))  PROTEIN / CREATININE RATIO, URINE     Status: Abnormal   Collection Time    09/22/13  2:25 PM      Result Value Ref  Range   Creatinine, Urine 74.26     Total Protein, Urine 17.4     PROTEIN CREATININE RATIO 0.23 (*) 0.00 - 0.15  CBC     Status: Abnormal   Collection Time    09/22/13  3:05 PM      Result Value Ref Range   WBC 9.7  4.0 - 10.5 K/uL   RBC 3.83 (*) 3.87 - 5.11 MIL/uL   Hemoglobin 12.5  12.0 - 15.0 g/dL   HCT 16.136.7  09.636.0 - 04.546.0 %   MCV 95.8  78.0 - 100.0 fL   MCH 32.6  26.0 - 34.0 pg   MCHC 34.1  30.0 - 36.0 g/dL   RDW 40.915.2  81.111.5 - 91.415.5 %   Platelets 258  150 - 400 K/uL  COMPREHENSIVE METABOLIC PANEL     Status: Abnormal   Collection Time    09/22/13  3:05 PM      Result Value Ref Range   Sodium 140  137 - 147 mEq/L   Potassium 3.7  3.7 - 5.3 mEq/L   Chloride 104  96 - 112 mEq/L   CO2 21  19 - 32 mEq/L   Glucose, Bld 74  70 - 99 mg/dL   BUN 5 (*) 6 - 23 mg/dL   Creatinine, Ser 7.820.42 (*) 0.50 - 1.10 mg/dL   Calcium 9.3  8.4 - 95.610.5 mg/dL   Total Protein 6.0  6.0 - 8.3 g/dL   Albumin 2.6 (*) 3.5 - 5.2 g/dL   AST 21  0 - 37 U/L   ALT 17  0 - 35 U/L   Alkaline Phosphatase 95  39 - 117 U/L   Total Bilirubin 0.3  0.3 - 1.2 mg/dL   GFR calc non Af Amer >90  >90 mL/min   GFR calc Af Amer >90  >90 mL/min  URIC ACID     Status: None   Collection Time    09/22/13  3:05 PM      Result Value Ref Range   Uric Acid, Serum 4.4  2.4 - 7.0 mg/dL   [id number]: Dr. Henderson Cloudomblin here to see the patient.   Assessment and Plan  Gestational hypertension Private MD has assumed care of the patient, will likely admit for IOL  Tawnya CrookHogan, Zelena Bushong Donovan 09/22/2013, 3:59 PM

## 2013-09-22 NOTE — MAU Note (Signed)
Pt sent from office for PIH eval.  "white coat syndrome". Pt has been on bedrest for incompetent cervix, has cerclage.  Gets biweekly NSTs

## 2013-09-22 NOTE — MAU Note (Signed)
Patient was seen in the office today and her blood pressure was elevated. Sent to MAU for evaluation. Denies any S/S of elevated blood pressure. Reports good fetal movement, no bleeding, leaking or contractions.

## 2013-09-23 ENCOUNTER — Encounter (HOSPITAL_COMMUNITY): Payer: Self-pay | Admitting: *Deleted

## 2013-09-23 ENCOUNTER — Inpatient Hospital Stay (HOSPITAL_COMMUNITY): Payer: 59 | Admitting: Anesthesiology

## 2013-09-23 ENCOUNTER — Encounter (HOSPITAL_COMMUNITY)
Admission: AD | Disposition: A | Discharge status: Home / Self Care | Payer: Self-pay | Source: Ambulatory Visit | Attending: Obstetrics and Gynecology

## 2013-09-23 ENCOUNTER — Encounter (HOSPITAL_COMMUNITY): Payer: 59 | Admitting: Anesthesiology

## 2013-09-23 LAB — CBC
HCT: 37.1 % (ref 36.0–46.0)
HCT: 33.4 % — ABNORMAL LOW (ref 36.0–46.0)
HEMOGLOBIN: 11.3 g/dL — AB (ref 12.0–15.0)
Hemoglobin: 12.4 g/dL (ref 12.0–15.0)
MCH: 32 pg (ref 26.0–34.0)
MCH: 32.8 pg (ref 26.0–34.0)
MCHC: 33.4 g/dL (ref 30.0–36.0)
MCHC: 33.8 g/dL (ref 30.0–36.0)
MCV: 95.9 fL (ref 78.0–100.0)
MCV: 96.8 fL (ref 78.0–100.0)
PLATELETS: 225 10*3/uL (ref 150–400)
Platelets: 234 10*3/uL (ref 150–400)
RBC: 3.87 MIL/uL (ref 3.87–5.11)
RBC: 3.45 MIL/uL — AB (ref 3.87–5.11)
RDW: 15.2 % (ref 11.5–15.5)
RDW: 15.1 % (ref 11.5–15.5)
WBC: 12 10*3/uL — AB (ref 4.0–10.5)
WBC: 17.8 10*3/uL — ABNORMAL HIGH (ref 4.0–10.5)

## 2013-09-23 LAB — GLUCOSE, CAPILLARY
GLUCOSE-CAPILLARY: 102 mg/dL — AB (ref 70–99)
GLUCOSE-CAPILLARY: 106 mg/dL — AB (ref 70–99)
Glucose-Capillary: 109 mg/dL — ABNORMAL HIGH (ref 70–99)
Glucose-Capillary: 94 mg/dL (ref 70–99)
Glucose-Capillary: 81 mg/dL (ref 70–99)
Glucose-Capillary: 93 mg/dL (ref 70–99)

## 2013-09-23 LAB — RPR

## 2013-09-23 SURGERY — Surgical Case
Anesthesia: Epidural

## 2013-09-23 MED ORDER — SODIUM BICARBONATE 8.4 % IV SOLN
INTRAVENOUS | Status: DC | PRN
  Administered 2013-09-23 (×2): 5 mL via EPIDURAL

## 2013-09-23 MED ORDER — SIMETHICONE 80 MG PO CHEW
80.0000 mg | CHEWABLE_TABLET | ORAL | Status: DC
  Administered 2013-09-25 – 2013-09-27 (×3): 80 mg via ORAL
  Filled 2013-09-23 (×5): qty 1

## 2013-09-23 MED ORDER — ZOLPIDEM TARTRATE 5 MG PO TABS: 5.0000 mg | ORAL_TABLET | Freq: Every evening | ORAL | Status: DC | PRN

## 2013-09-23 MED ORDER — ONDANSETRON HCL 4 MG/2ML IJ SOLN: 4.0000 mg | Freq: Three times a day (TID) | INTRAMUSCULAR | Status: DC | PRN

## 2013-09-23 MED ORDER — SIMETHICONE 80 MG PO CHEW
80.0000 mg | CHEWABLE_TABLET | Freq: Three times a day (TID) | ORAL | Status: DC
  Administered 2013-09-24 – 2013-09-27 (×7): 80 mg via ORAL
  Filled 2013-09-23 (×12): qty 1

## 2013-09-23 MED ORDER — FENTANYL 2.5 MCG/ML BUPIVACAINE 1/10 % EPIDURAL INFUSION (WH - ANES)
INTRAMUSCULAR | Status: AC
  Administered 2013-09-23: 14 mL/h via EPIDURAL
  Filled 2013-09-23: qty 125

## 2013-09-23 MED ORDER — TETANUS-DIPHTH-ACELL PERTUSSIS 5-2.5-18.5 LF-MCG/0.5 IM SUSP
0.5000 mL | Freq: Once | INTRAMUSCULAR | Status: DC
  Filled 2013-09-23: qty 0.5

## 2013-09-23 MED ORDER — LACTATED RINGERS IV SOLN
INTRAVENOUS | Status: DC | PRN
  Administered 2013-09-23: 18:00:00 via INTRAVENOUS

## 2013-09-23 MED ORDER — DIBUCAINE 1 % RE OINT
1.0000 | TOPICAL_OINTMENT | RECTAL | Status: DC | PRN
Start: 2013-09-23 — End: 2013-09-27
  Filled 2013-09-23: qty 28

## 2013-09-23 MED ORDER — DIPHENHYDRAMINE HCL 50 MG/ML IJ SOLN: 12.5000 mg | INTRAMUSCULAR | Status: DC | PRN

## 2013-09-23 MED ORDER — MEPERIDINE HCL 25 MG/ML IJ SOLN: 6.2500 mg | INTRAMUSCULAR | Status: DC | PRN

## 2013-09-23 MED ORDER — DEXTROSE 5 % IV SOLN
3.0000 g | INTRAVENOUS | Status: DC | PRN
  Administered 2013-09-23: 3 g via INTRAVENOUS

## 2013-09-23 MED ORDER — NALBUPHINE HCL 10 MG/ML IJ SOLN: 5.0000 mg | INTRAMUSCULAR | Status: DC | PRN

## 2013-09-23 MED ORDER — PHENYLEPHRINE 40 MCG/ML (10ML) SYRINGE FOR IV PUSH (FOR BLOOD PRESSURE SUPPORT)
PREFILLED_SYRINGE | INTRAVENOUS | Status: AC
  Filled 2013-09-23: qty 10

## 2013-09-23 MED ORDER — MORPHINE SULFATE (PF) 0.5 MG/ML IJ SOLN
INTRAMUSCULAR | Status: DC | PRN
  Administered 2013-09-23: 4 mg via EPIDURAL
  Administered 2013-09-23: 1 mg via INTRAVENOUS

## 2013-09-23 MED ORDER — WITCH HAZEL-GLYCERIN EX PADS: 1.0000 "application " | MEDICATED_PAD | CUTANEOUS | Status: DC | PRN

## 2013-09-23 MED ORDER — OXYTOCIN 10 UNIT/ML IJ SOLN
INTRAMUSCULAR | Status: AC
  Filled 2013-09-23: qty 1

## 2013-09-23 MED ORDER — IBUPROFEN 600 MG PO TABS: 600.0000 mg | ORAL_TABLET | Freq: Four times a day (QID) | ORAL | Status: DC | PRN

## 2013-09-23 MED ORDER — MIDAZOLAM HCL 2 MG/2ML IJ SOLN: 0.5000 mg | Freq: Once | INTRAMUSCULAR | Status: DC | PRN

## 2013-09-23 MED ORDER — OXYTOCIN 40 UNITS IN LACTATED RINGERS INFUSION - SIMPLE MED
1.0000 m[IU]/min | INTRAVENOUS | Status: DC
  Administered 2013-09-23: 2 m[IU]/min via INTRAVENOUS
  Filled 2013-09-23: qty 1000

## 2013-09-23 MED ORDER — DIPHENHYDRAMINE HCL 25 MG PO CAPS
25.0000 mg | ORAL_CAPSULE | ORAL | Status: DC | PRN
  Filled 2013-09-23: qty 1

## 2013-09-23 MED ORDER — BUPIVACAINE LIPOSOME 1.3 % IJ SUSP
INTRAMUSCULAR | Status: DC | PRN
  Administered 2013-09-23: 20 mL

## 2013-09-23 MED ORDER — SODIUM CHLORIDE 0.9 % IJ SOLN
INTRAMUSCULAR | Status: DC | PRN
  Administered 2013-09-23: 20 mL via INTRAVENOUS

## 2013-09-23 MED ORDER — KETOROLAC TROMETHAMINE 30 MG/ML IJ SOLN
INTRAMUSCULAR | Status: AC
  Administered 2013-09-23: 30 mg via INTRAMUSCULAR
  Filled 2013-09-23: qty 1

## 2013-09-23 MED ORDER — DIPHENHYDRAMINE HCL 25 MG PO CAPS
25.0000 mg | ORAL_CAPSULE | Freq: Four times a day (QID) | ORAL | Status: DC | PRN
  Filled 2013-09-23: qty 1

## 2013-09-23 MED ORDER — LACTATED RINGERS IV SOLN
INTRAVENOUS | Status: DC
  Administered 2013-09-24: 21:00:00 via INTRAVENOUS

## 2013-09-23 MED ORDER — PHENYLEPHRINE HCL 10 MG/ML IJ SOLN
INTRAMUSCULAR | Status: DC | PRN
  Administered 2013-09-23 (×2): 40 ug via INTRAVENOUS

## 2013-09-23 MED ORDER — BUPIVACAINE LIPOSOME 1.3 % IJ SUSP
20.0000 mL | Freq: Once | INTRAMUSCULAR | Status: DC
  Filled 2013-09-23: qty 20

## 2013-09-23 MED ORDER — ACETAMINOPHEN 500 MG PO TABS
1000.0000 mg | ORAL_TABLET | Freq: Four times a day (QID) | ORAL | Status: AC
  Administered 2013-09-24 (×2): 1000 mg via ORAL
  Filled 2013-09-23 (×2): qty 2

## 2013-09-23 MED ORDER — PRENATAL MULTIVITAMIN CH
1.0000 | ORAL_TABLET | Freq: Every day | ORAL | Status: DC
  Administered 2013-09-24 – 2013-09-26 (×3): 1 via ORAL
  Filled 2013-09-23 (×4): qty 1

## 2013-09-23 MED ORDER — SIMETHICONE 80 MG PO CHEW
80.0000 mg | CHEWABLE_TABLET | ORAL | Status: DC | PRN
  Filled 2013-09-23: qty 1

## 2013-09-23 MED ORDER — METOCLOPRAMIDE HCL 5 MG/ML IJ SOLN
10.0000 mg | Freq: Three times a day (TID) | INTRAMUSCULAR | Status: DC | PRN
  Administered 2013-09-24: 10 mg via INTRAVENOUS
  Filled 2013-09-23: qty 2

## 2013-09-23 MED ORDER — ZOLPIDEM TARTRATE 5 MG PO TABS
5.0000 mg | ORAL_TABLET | Freq: Every evening | ORAL | Status: DC | PRN
  Administered 2013-09-23: 5 mg via ORAL
  Filled 2013-09-23: qty 1

## 2013-09-23 MED ORDER — LANOLIN HYDROUS EX OINT: 1.0000 "application " | TOPICAL_OINTMENT | CUTANEOUS | Status: DC | PRN

## 2013-09-23 MED ORDER — SENNOSIDES-DOCUSATE SODIUM 8.6-50 MG PO TABS
2.0000 | ORAL_TABLET | ORAL | Status: DC
  Administered 2013-09-25 – 2013-09-27 (×3): 2 via ORAL
  Filled 2013-09-23 (×5): qty 2

## 2013-09-23 MED ORDER — SODIUM CHLORIDE 0.9 % IJ SOLN: 3.0000 mL | INTRAMUSCULAR | Status: DC | PRN

## 2013-09-23 MED ORDER — SCOPOLAMINE 1 MG/3DAYS TD PT72
1.0000 | MEDICATED_PATCH | Freq: Once | TRANSDERMAL | Status: AC
  Administered 2013-09-23: 1.5 mg via TRANSDERMAL

## 2013-09-23 MED ORDER — PROMETHAZINE HCL 25 MG/ML IJ SOLN: 6.2500 mg | INTRAMUSCULAR | Status: DC | PRN

## 2013-09-23 MED ORDER — ONDANSETRON HCL 4 MG/2ML IJ SOLN
4.0000 mg | INTRAMUSCULAR | Status: DC | PRN
  Administered 2013-09-23 – 2013-09-24 (×2): 4 mg via INTRAVENOUS
  Filled 2013-09-23 (×2): qty 2

## 2013-09-23 MED ORDER — ONDANSETRON HCL 4 MG PO TABS: 4.0000 mg | ORAL_TABLET | ORAL | Status: DC | PRN

## 2013-09-23 MED ORDER — DEXTROSE 5 % IV SOLN
INTRAVENOUS | Status: AC
  Filled 2013-09-23: qty 3000

## 2013-09-23 MED ORDER — SODIUM BICARBONATE 8.4 % IV SOLN
INTRAVENOUS | Status: AC
  Filled 2013-09-23: qty 50

## 2013-09-23 MED ORDER — DIPHENHYDRAMINE HCL 50 MG/ML IJ SOLN: 25.0000 mg | INTRAMUSCULAR | Status: DC | PRN

## 2013-09-23 MED ORDER — FENTANYL CITRATE 0.05 MG/ML IJ SOLN
INTRAMUSCULAR | Status: AC
  Filled 2013-09-23: qty 2

## 2013-09-23 MED ORDER — OXYTOCIN 10 UNIT/ML IJ SOLN
40.0000 [IU] | INTRAVENOUS | Status: DC | PRN
  Administered 2013-09-23: 40 [IU] via INTRAVENOUS

## 2013-09-23 MED ORDER — FENTANYL CITRATE 0.05 MG/ML IJ SOLN: 25.0000 ug | INTRAMUSCULAR | Status: DC | PRN

## 2013-09-23 MED ORDER — ONDANSETRON HCL 4 MG/2ML IJ SOLN
INTRAMUSCULAR | Status: AC
  Filled 2013-09-23: qty 2

## 2013-09-23 MED ORDER — KETOROLAC TROMETHAMINE 30 MG/ML IJ SOLN
30.0000 mg | Freq: Four times a day (QID) | INTRAMUSCULAR | Status: AC | PRN
  Administered 2013-09-23: 30 mg via INTRAMUSCULAR

## 2013-09-23 MED ORDER — LIDOCAINE HCL (PF) 1 % IJ SOLN
INTRAMUSCULAR | Status: DC | PRN
  Administered 2013-09-23 (×2): 5 mL

## 2013-09-23 MED ORDER — DEXTROSE 5 % IV SOLN: 1.0000 ug/kg/h | INTRAVENOUS | Status: DC | PRN

## 2013-09-23 MED ORDER — LIDOCAINE-EPINEPHRINE (PF) 2 %-1:200000 IJ SOLN
INTRAMUSCULAR | Status: AC
  Filled 2013-09-23: qty 20

## 2013-09-23 MED ORDER — MENTHOL 3 MG MT LOZG
1.0000 | LOZENGE | OROMUCOSAL | Status: DC | PRN
  Filled 2013-09-23: qty 9

## 2013-09-23 MED ORDER — ONDANSETRON HCL 4 MG/2ML IJ SOLN
INTRAMUSCULAR | Status: DC | PRN
  Administered 2013-09-23: 4 mg via INTRAVENOUS

## 2013-09-23 MED ORDER — TERBUTALINE SULFATE 1 MG/ML IJ SOLN: 0.2500 mg | Freq: Once | INTRAMUSCULAR | Status: DC | PRN

## 2013-09-23 MED ORDER — EPHEDRINE 5 MG/ML INJ
INTRAVENOUS | Status: AC
  Filled 2013-09-23: qty 4

## 2013-09-23 MED ORDER — OXYTOCIN 40 UNITS IN LACTATED RINGERS INFUSION - SIMPLE MED: 62.5000 mL/h | INTRAVENOUS | Status: AC

## 2013-09-23 MED ORDER — MORPHINE SULFATE 0.5 MG/ML IJ SOLN
INTRAMUSCULAR | Status: AC
  Filled 2013-09-23: qty 10

## 2013-09-23 MED ORDER — SODIUM CHLORIDE 0.9 % IJ SOLN
INTRAMUSCULAR | Status: AC
  Filled 2013-09-23: qty 20

## 2013-09-23 MED ORDER — SCOPOLAMINE 1 MG/3DAYS TD PT72
MEDICATED_PATCH | TRANSDERMAL | Status: AC
  Filled 2013-09-23: qty 1

## 2013-09-23 MED ORDER — PHENYLEPHRINE 40 MCG/ML (10ML) SYRINGE FOR IV PUSH (FOR BLOOD PRESSURE SUPPORT)
PREFILLED_SYRINGE | INTRAVENOUS | Status: AC
  Filled 2013-09-23: qty 5

## 2013-09-23 MED ORDER — PENICILLIN G POTASSIUM 5000000 UNITS IJ SOLR
5.0000 10*6.[IU] | Freq: Once | INTRAVENOUS | Status: AC
  Administered 2013-09-23: 5 10*6.[IU] via INTRAVENOUS
  Filled 2013-09-23: qty 5

## 2013-09-23 MED ORDER — HYDROMORPHONE HCL 2 MG PO TABS
2.0000 mg | ORAL_TABLET | ORAL | Status: DC | PRN
  Administered 2013-09-25 – 2013-09-27 (×4): 2 mg via ORAL
  Filled 2013-09-23 (×4): qty 1

## 2013-09-23 MED ORDER — KETOROLAC TROMETHAMINE 30 MG/ML IJ SOLN: 30.0000 mg | Freq: Four times a day (QID) | INTRAMUSCULAR | Status: AC | PRN

## 2013-09-23 MED ORDER — NALOXONE HCL 0.4 MG/ML IJ SOLN: 0.4000 mg | INTRAMUSCULAR | Status: DC | PRN

## 2013-09-23 MED ORDER — PENICILLIN G POTASSIUM 5000000 UNITS IJ SOLR
2.5000 10*6.[IU] | INTRAVENOUS | Status: DC
  Administered 2013-09-23: 2.5 10*6.[IU] via INTRAVENOUS
  Filled 2013-09-23 (×5): qty 2.5

## 2013-09-23 MED ORDER — IBUPROFEN 600 MG PO TABS
600.0000 mg | ORAL_TABLET | Freq: Four times a day (QID) | ORAL | Status: DC
  Administered 2013-09-24 – 2013-09-27 (×12): 600 mg via ORAL
  Filled 2013-09-23 (×12): qty 1

## 2013-09-23 SURGICAL SUPPLY — 37 items
ADH SKN CLS APL DERMABOND .7 (GAUZE/BANDAGES/DRESSINGS)
APL SKNCLS STERI-STRIP NONHPOA (GAUZE/BANDAGES/DRESSINGS) ×1
BENZOIN TINCTURE PRP APPL 2/3 (GAUZE/BANDAGES/DRESSINGS) ×1 IMPLANT
CLAMP CORD UMBIL (MISCELLANEOUS) IMPLANT
CLOTH BEACON ORANGE TIMEOUT ST (SAFETY) ×2 IMPLANT
CONTAINER PREFILL 10% NBF 15ML (MISCELLANEOUS) IMPLANT
DERMABOND ADVANCED (GAUZE/BANDAGES/DRESSINGS)
DERMABOND ADVANCED .7 DNX12 (GAUZE/BANDAGES/DRESSINGS) IMPLANT
DRAPE LG THREE QUARTER DISP (DRAPES) IMPLANT
DRESSING DISP NPWT PICO 4X12 (MISCELLANEOUS) ×1 IMPLANT
DRSG OPSITE POSTOP 4X10 (GAUZE/BANDAGES/DRESSINGS) ×1 IMPLANT
DURAPREP 26ML APPLICATOR (WOUND CARE) ×2 IMPLANT
ELECT REM PT RETURN 9FT ADLT (ELECTROSURGICAL) ×2
ELECTRODE REM PT RTRN 9FT ADLT (ELECTROSURGICAL) ×1 IMPLANT
EXTRACTOR VACUUM M CUP 4 TUBE (SUCTIONS) IMPLANT
GLOVE BIO SURGEON STRL SZ8 (GLOVE) ×2 IMPLANT
GOWN STRL REUS W/TWL LRG LVL3 (GOWN DISPOSABLE) ×4 IMPLANT
KIT ABG SYR 3ML LUER SLIP (SYRINGE) ×2 IMPLANT
NDL HYPO 21X1.5 SAFETY (NEEDLE) ×1 IMPLANT
NDL HYPO 25X5/8 SAFETYGLIDE (NEEDLE) ×1 IMPLANT
NEEDLE HYPO 21X1.5 SAFETY (NEEDLE) ×2 IMPLANT
NEEDLE HYPO 25X5/8 SAFETYGLIDE (NEEDLE) ×2 IMPLANT
NS IRRIG 1000ML POUR BTL (IV SOLUTION) ×2 IMPLANT
PACK C SECTION WH (CUSTOM PROCEDURE TRAY) ×2 IMPLANT
PAD OB MATERNITY 4.3X12.25 (PERSONAL CARE ITEMS) ×2 IMPLANT
STAPLER VISISTAT 35W (STAPLE) IMPLANT
STRIP CLOSURE SKIN 1/2X4 (GAUZE/BANDAGES/DRESSINGS) ×1 IMPLANT
SUT MNCRL 0 VIOLET CTX 36 (SUTURE) ×4 IMPLANT
SUT MONOCRYL 0 CTX 36 (SUTURE) ×4
SUT PDS AB 0 CTX 60 (SUTURE) ×2 IMPLANT
SUT PLAIN 0 NONE (SUTURE) IMPLANT
SUT PLAIN 2 0 XLH (SUTURE) ×1 IMPLANT
SUT VIC AB 4-0 KS 27 (SUTURE) ×2 IMPLANT
SYR 20CC LL (SYRINGE) ×2 IMPLANT
TOWEL OR 17X24 6PK STRL BLUE (TOWEL DISPOSABLE) ×2 IMPLANT
TRAY FOLEY CATH 14FR (SET/KITS/TRAYS/PACK) ×2 IMPLANT
WATER STERILE IRR 1000ML POUR (IV SOLUTION) ×2 IMPLANT

## 2013-09-23 NOTE — Op Note (Signed)
NAMBeckie Busing:  Endo, Euna              ACCOUNT NO.:  [id number]  MEDICAL RECORD NO.:  [id number]  LOCATION:  [facility]                          FACILITY:  [facility]  PHYSICIAN:  Guy SandiferJames E. Henderson Cloudomblin, M.D. DATE OF BIRTH:  Jun 18, 1970  DATE OF PROCEDURE:  09/23/2013 DATE OF DISCHARGE:                              OPERATIVE REPORT   PREOPERATIVE DIAGNOSIS: 1. Intrauterine pregnancy at 4237 and 4/7th weeks. 2. Chronic hypertension in pregnancy with increasingly labile blood     pressures. 3. Failed induction.  POSTOPERATIVE DIAGNOSIS: 1. Intrauterine pregnancy at 9637 and 4/7th weeks. 2. Chronic hypertension in pregnancy with increasingly labile blood     pressures. 3. Failed induction.  PROCEDURE:  Low-transverse cesarean section.  SURGEON:  Guy SandiferJames E. Henderson Cloudomblin, M.D.  ASSISTANT:  Lenoard Adenichard J. Taavon, M.D.  ANESTHESIA:  Epidural.  ESTIMATED BLOOD LOSS:  500 mL.  FINDINGS:  Viable female infant.  Apgars, arterial cord pH, birth weight pending at the time of dictation.  INDICATIONS AND CONSENT:  This patient is a 43 year old, G2, P0, at 11037 and 4/7th weeks, who has chronic hypertension with increasingly labile blood pressures as well as gestational diabetes.  Details are dictated history and physical.  She is admitted to the hospital for induction of labor.  She undergoes a 2-stage induction.  She progresses to 2 to 3 cm dilation as artificial rupture of membranes with clear fluid.  An IUPC was placed.  Pitocin induction was carried out.  After greater than 2 hours of labor with excellent MVUs, the vertex remained completely out of the pelvis.  Cesarean section was recommended.  Potential risks and complications were reviewed with the patient preoperatively including but not limited to, infection, organ damage, bleeding requiring transfusion of blood products with HIV and hepatitis acquisition, DVT, PE, pneumonia, return to the operating room, breakdown of the incision. All questions had been answered and  consent is signed on the chart.  DESCRIPTION OF PROCEDURE:  The patient was taken to the operating room, where she is identified.  Her epidural anesthetic was augmented to surgical level, and she is placed in dorsal supine position with 15 degree left lateral wedge.  Foley catheter is in place.  [facility] and [facility].  Time-out undertaken.  After testing for adequate epidural anesthesia, skin was entered through a Pfannenstiel incision.  Dissection carried out in layers to the peritoneum which was entered bluntly.  Incision was carried out superiorly and inferiorly.  Vesicouterine peritoneum was taken down cephalad laterally.  The bladder flap was developed.  The bladder blade was placed.  Uterus was entered in a low transverse manner, and the uterine cavity was entered bluntly with a hemostat.  Uterine incision was extended cephalad laterally with fingers.  Vertex was then delivered without difficulty, and the baby was delivered.  Good cry and tone was noted.  Cord was clamped and cut.  The baby was handed to awaiting pediatrics team.  Placenta was manually delivered and sent to pathology. Uterine cavity was cleaned.  Uterus was closed in a running locking manner with 0 Monocryl suture.  Additional suture of 0 Monocryl was placed in a figure-of-eight fashion in the center of the incision  to obtain complete hemostasis.  Anterior peritoneum was closed in a running fashion with 0 Monocryl suture which was also used to reapproximate the pyramidalis muscle in the midline.  Anterior rectus fascia was closed in a running fashion with a 0 looped PDS suture.  Exparel 20 mL in 20 mL of normal saline was then injected both subfascially and subcutaneously. Subcutaneous tissues were then reapproximated with plain suture, and the skin was closed in a subcuticular fashion with Vicryl on a Keith needle. A PICO suction dressing was then applied.  All counts correct.   The patient was taken to recovery room in stable condition.     Guy SandiferJames E. Henderson Cloudomblin, M.D.     JET/MEDQ  D:  09/23/2013  T:  09/23/2013  Job:  [id number]

## 2013-09-23 NOTE — Anesthesia Preprocedure Evaluation (Signed)
Anesthesia Evaluation  Patient identified by MRN, date of birth, ID band Patient awake    Reviewed: Allergy & Precautions, H&P , Patient's Chart, lab work & pertinent test results  Airway Mallampati: III TM Distance: >3 FB Neck ROM: full    Dental   Pulmonary asthma ,  breath sounds clear to auscultation        Cardiovascular hypertension, Rhythm:regular Rate:Normal     Neuro/Psych    GI/Hepatic   Endo/Other  diabetesMorbid obesity  Renal/GU      Musculoskeletal   Abdominal   Peds  Hematology   Anesthesia Other Findings   Reproductive/Obstetrics (+) Pregnancy                           Anesthesia Physical Anesthesia Plan  ASA: III  Anesthesia Plan: Epidural   Post-op Pain Management:    Induction:   Airway Management Planned:   Additional Equipment:   Intra-op Plan:   Post-operative Plan:   Informed Consent: I have reviewed the patients History and Physical, chart, labs and discussed the procedure including the risks, benefits and alternatives for the proposed anesthesia with the patient or authorized representative who has indicated his/her understanding and acceptance.     Plan Discussed with:   Anesthesia Plan Comments:         Anesthesia Quick Evaluation

## 2013-09-23 NOTE — Progress Notes (Signed)
No HA or vision change, no epigastric pain  BPs OK  FHT reactive UCs q2-4 min Cx 2-3/50/-3/vtx this am with nurse check    Pitocin then started Now cervix 1-2/soft/vertex ballotable , difficult to reach Bedside U/S + vertex  Continue pitocin

## 2013-09-23 NOTE — Anesthesia Procedure Notes (Signed)
Epidural Patient location during procedure: OB Start time: 09/23/2013 3:42 PM  Staffing Anesthesiologist: Brayton CavesJACKSON, FREEMAN Performed by: anesthesiologist   Preanesthetic Checklist Completed: patient identified, site marked, surgical consent, pre-op evaluation, timeout performed, IV checked, risks and benefits discussed and monitors and equipment checked  Epidural Patient position: sitting Prep: site prepped and draped and DuraPrep Patient monitoring: continuous pulse ox and blood pressure Approach: midline Location: L3-L4 Injection technique: LOR air  Needle:  Needle type: Tuohy  Needle gauge: 17 G Needle length: 9 cm and 9 Needle insertion depth: 9 cm Catheter type: closed end flexible Catheter size: 19 Gauge Catheter at skin depth: 14 cm Test dose: negative  Assessment Events: blood not aspirated, injection not painful, no injection resistance, negative IV test and no paresthesia  Additional Notes Patient identified.  Risk benefits discussed including failed block, incomplete pain control, headache, nerve damage, paralysis, blood pressure changes, nausea, vomiting, reactions to medication both toxic or allergic, and postpartum back pain.  Patient expressed understanding and wished to proceed.  All questions were answered.  Sterile technique used throughout procedure and epidural site dressed with sterile barrier dressing. No paresthesia or other complications noted.The patient did not experience any signs of intravascular injection such as tinnitus or metallic taste in mouth nor signs of intrathecal spread such as rapid motor block. Please see nursing notes for vital signs. ]

## 2013-09-23 NOTE — Transfer of Care (Signed)
Immediate Anesthesia Transfer of Care Note  Patient: Evelyn [name]  Procedure(s) Performed: Procedure(s): CESAREAN SECTION (N/A)  Patient Location: PACU  Anesthesia Type:Epidural  Level of Consciousness: awake  Airway & Oxygen Therapy: Patient Spontanous Breathing  Post-op Assessment: Report given to PACU RN and Post -op Vital signs reviewed and stable  Post vital signs: stable  Complications: No apparent anesthesia complications

## 2013-09-23 NOTE — Consult Note (Signed)
Neonatology Note:  Attendance at C-section:  I was asked by Dr. Tomblin to attend this primary C/S at 37 4/7 weeks due to FTP. The mother is a G2P0A1 A pos, GBS positive with GDM, HTN, and obesity. ROM 3 hours prior to delivery, fluid clear. Mother received Pen G > 4 hours before delivery and was afebrile during labor. She was on Labetalol and Glyburide. Infant vigorous with good spontaneous cry and tone. Needed only minimal bulb suctioning. Ap 7/9. Lungs clear to ausc in DR. To CN to care of Pediatrician.  Christie C. DaVanzo, MD  

## 2013-09-23 NOTE — Progress Notes (Signed)
FHT reactive UCs q 2-3 min Cx vtx out of pelvis  A: Arrest of descent  P: D/W patient failed induction      Rec: cesarean section      D/W risks including infection, organ damage, bleeding/transfusion-HIV/Hep, DVT/PE, pneumonia, return to OR, breakdown of wound.      All questions answered.

## 2013-09-23 NOTE — Progress Notes (Signed)
FHT + accels Cx 3/C/-3 AROM clear IUPC placed

## 2013-09-23 NOTE — Brief Op Note (Signed)
09/22/2013 - 09/23/2013  6:42 PM  PATIENT:  Evelyn [name]  43 y.o. female  PRE-OPERATIVE DIAGNOSIS:  CHTN in pregnancy, failed induction  POST-OPERATIVE DIAGNOSIS:  CHTN in pregnancy, failed induction  PROCEDURE:  Procedure(s): CESAREAN SECTION (N/A)  SURGEON:  Surgeon(s) and Role:    * Leslie AndreaJames E Tomblin II, MD - Primary    * Lenoard Adenichard J Taavon, MD - Assisting  PHYSICIAN ASSISTANT:   ASSISTANTS: Taavon   ANESTHESIA:   epidural  EBL:  Total I/O In: 2200 [I.V.:2200] Out: 550 [Urine:50; Blood:500]  BLOOD ADMINISTERED:none  DRAINS: Urinary Catheter (Foley)   LOCAL MEDICATIONS USED:  Amount: 20 ml in 20 ml saline and OTHER Exparel  SPECIMEN:  Source of Specimen:  placenta  DISPOSITION OF SPECIMEN:  PATHOLOGY  COUNTS:  YES  TOURNIQUET:  * No tourniquets in log *  DICTATION: .Other Dictation: Dictation Number [id number]  PLAN OF CARE: Admit to inpatient   PATIENT DISPOSITION:  PACU - hemodynamically stable.   Delay start of Pharmacological VTE agent (>24hrs) due to surgical blood loss or risk of bleeding: not applicable

## 2013-09-24 ENCOUNTER — Inpatient Hospital Stay (HOSPITAL_COMMUNITY): Admit: 2013-09-24 | Payer: 59

## 2013-09-24 LAB — COMPREHENSIVE METABOLIC PANEL
ALBUMIN: 2.2 g/dL — AB (ref 3.5–5.2)
ALT: 15 U/L (ref 0–35)
ALT: 14 U/L (ref 0–35)
AST: 21 U/L (ref 0–37)
AST: 20 U/L (ref 0–37)
Albumin: 2 g/dL — ABNORMAL LOW (ref 3.5–5.2)
Alkaline Phosphatase: 83 U/L (ref 39–117)
Alkaline Phosphatase: 80 U/L (ref 39–117)
BILIRUBIN TOTAL: 0.3 mg/dL (ref 0.3–1.2)
BUN: 7 mg/dL (ref 6–23)
BUN: 7 mg/dL (ref 6–23)
CALCIUM: 9.1 mg/dL (ref 8.4–10.5)
CHLORIDE: 102 meq/L (ref 96–112)
CO2: 25 mEq/L (ref 19–32)
CO2: 26 meq/L (ref 19–32)
Calcium: 9.2 mg/dL (ref 8.4–10.5)
Chloride: 102 mEq/L (ref 96–112)
Creatinine, Ser: 0.49 mg/dL — ABNORMAL LOW (ref 0.50–1.10)
Creatinine, Ser: 0.54 mg/dL (ref 0.50–1.10)
GFR calc Af Amer: >90 mL/min (ref >90)
GFR calc Af Amer: >90 mL/min (ref >90)
GFR calc non Af Amer: >90 mL/min (ref >90)
Glucose, Bld: 162 mg/dL — ABNORMAL HIGH (ref 70–99)
Glucose, Bld: 136 mg/dL — ABNORMAL HIGH (ref 70–99)
Potassium: 4 mEq/L (ref 3.7–5.3)
Potassium: 4.1 mEq/L (ref 3.7–5.3)
SODIUM: 137 meq/L (ref 137–147)
Sodium: 139 mEq/L (ref 137–147)
Total Bilirubin: 0.3 mg/dL (ref 0.3–1.2)
Total Protein: 5.8 g/dL — ABNORMAL LOW (ref 6.0–8.3)
Total Protein: 5 g/dL — ABNORMAL LOW (ref 6.0–8.3)

## 2013-09-24 LAB — CBC
HCT: 31.8 % — ABNORMAL LOW (ref 36.0–46.0)
HCT: 30.2 % — ABNORMAL LOW (ref 36.0–46.0)
Hemoglobin: 10.6 g/dL — ABNORMAL LOW (ref 12.0–15.0)
Hemoglobin: 9.9 g/dL — ABNORMAL LOW (ref 12.0–15.0)
MCH: 32.5 pg (ref 26.0–34.0)
MCH: 32.1 pg (ref 26.0–34.0)
MCHC: 33.3 g/dL (ref 30.0–36.0)
MCHC: 32.8 g/dL (ref 30.0–36.0)
MCV: 97.5 fL (ref 78.0–100.0)
MCV: 98.1 fL (ref 78.0–100.0)
PLATELETS: 221 10*3/uL (ref 150–400)
PLATELETS: 212 10*3/uL (ref 150–400)
RBC: 3.26 MIL/uL — ABNORMAL LOW (ref 3.87–5.11)
RBC: 3.08 MIL/uL — ABNORMAL LOW (ref 3.87–5.11)
RDW: 15.3 % (ref 11.5–15.5)
RDW: 15.5 % (ref 11.5–15.5)
WBC: 13 10*3/uL — ABNORMAL HIGH (ref 4.0–10.5)
WBC: 13.5 10*3/uL — AB (ref 4.0–10.5)

## 2013-09-24 MED ORDER — LABETALOL HCL 100 MG PO TABS
100.0000 mg | ORAL_TABLET | Freq: Four times a day (QID) | ORAL | Status: DC
  Administered 2013-09-24 – 2013-09-25 (×6): 100 mg via ORAL
  Filled 2013-09-24: qty 1
  Filled 2013-09-24: qty 0.5
  Filled 2013-09-24 (×5): qty 1
  Filled 2013-09-24: qty 0.5

## 2013-09-24 MED ORDER — ACETAMINOPHEN 325 MG PO TABS: 650.0000 mg | ORAL_TABLET | ORAL | Status: DC | PRN

## 2013-09-24 MED ORDER — PANTOPRAZOLE SODIUM 40 MG IV SOLR
40.0000 mg | Freq: Once | INTRAVENOUS | Status: AC
  Administered 2013-09-24: 40 mg via INTRAVENOUS
  Filled 2013-09-24: qty 40

## 2013-09-24 MED ORDER — LACTATED RINGERS IV BOLUS (SEPSIS)
500.0000 mL | Freq: Once | INTRAVENOUS | Status: DC
Start: 2013-09-24 — End: 2013-09-24

## 2013-09-24 MED ORDER — PANTOPRAZOLE SODIUM 40 MG PO TBEC
40.0000 mg | DELAYED_RELEASE_TABLET | Freq: Every day | ORAL | Status: DC
  Administered 2013-09-26 – 2013-09-27 (×2): 40 mg via ORAL
  Filled 2013-09-24 (×2): qty 1

## 2013-09-24 MED ORDER — LACTATED RINGERS IV BOLUS (SEPSIS)
500.0000 mL | Freq: Once | INTRAVENOUS | Status: AC
  Administered 2013-09-24: 10:00:00 via INTRAVENOUS

## 2013-09-24 MED ORDER — LACTATED RINGERS IV BOLUS (SEPSIS)
500.0000 mL | Freq: Once | INTRAVENOUS | Status: AC
  Administered 2013-09-24: 16:00:00 via INTRAVENOUS

## 2013-09-24 MED ORDER — HYDROMORPHONE HCL 2 MG PO TABS: 1.0000 mg | ORAL_TABLET | ORAL | Status: DC | PRN

## 2013-09-24 MED ORDER — LACTATED RINGERS IV BOLUS (SEPSIS)
500.0000 mL | Freq: Once | INTRAVENOUS | Status: AC
  Administered 2013-09-24: 03:00:00 via INTRAVENOUS

## 2013-09-24 MED ORDER — DEXTROSE 5 % IV SOLN: 3.0000 g | INTRAVENOUS | Status: DC

## 2013-09-24 NOTE — Anesthesia Postprocedure Evaluation (Signed)
Anesthesia Post Note  Patient: Evelyn [name]  Procedure(s) Performed: Procedure(s) (LRB): CESAREAN SECTION (N/A)  Anesthesia type: Epidural  Patient location: Mother/Baby  Post pain: Pain level controlled  Post assessment: Post-op Vital signs reviewed  Last Vitals:  Filed Vitals:   09/24/13 1000  BP: 148/84  Pulse: 84  Temp: 36.7 C  Resp: 20    Post vital signs: Reviewed  Level of consciousness:alert  Complications: No apparent anesthesia complications

## 2013-09-24 NOTE — Progress Notes (Signed)
Foley emptied @ 1230 after ambulating to bathroom 250 ml brown urine noted.  No further output noted in foley @ 1500 [facility]  Dr. Henderson Cloudomblin to be notified

## 2013-09-24 NOTE — Lactation Note (Signed)
This note was copied from the chart of Evelyn [name]. Lactation Consultation Note  Patient Name: Evelyn Scarlette SliceLeigh Seiter RUEAV'WToday's Date: 09/24/2013 Reason for consult: Initial assessment  Baby 27 hours of life. Mom state she has had nausea and vomiting and been dehydrated and FOB has been assisted her to latch a few times then supplementing baby with formula. Mom is feeling better now, but concerned that she will not produce enough for baby since she has put the baby to breast only a few times. Assisted mom to hand express. Mom return demonstrated hand expression, and after a few minutes was able to see drops of colostrum. Gave drops of colostrum to baby with LC's gloved finger, but baby asleep and satisfied from last bottle and would not wake. Set mom up with DEBP. Gave instructions for cleaning and set up to Mom and FOB. Plan is for mom to massage and hand express, attempt to latch baby at breast, then offer baby supplementation of EBM and formula if needed according to supplementation guidelines. Mom can post pump for stimulation and give EBM at next feeding. Enc mom to nurse with cues, and at least 8-12 times/24 hours. Discussed with mom to look at nipples after pumping to see if they evert more. Discussed with mom that she may need a nipple shield tomorrow, depending on how much her nipples evert with pumping and the baby suckling. [facility], aware of OP/BFSG and community resources. Discussed plan with patient's M[facility] RN, Evelyn [name].  Maternal Data Has patient been taught Hand Expression?: Yes Does the patient have breastfeeding experience prior to this delivery?: No  Feeding Feeding Type:  (Mom had supplemented with formula, baby would not wake even for drops of colostrum.) Nipple Type: Slow - flow  LATCH Score/Interventions Latch: Too sleepy or reluctant, no latch achieved, no sucking elicited.  Audible Swallowing: None  Type of Nipple: Flat Intervention(s): Double electric  pump  Comfort (Breast/Nipple): Soft / non-tender     Hold (Positioning): Full assist, staff holds infant at breast  LATCH Score: 3  Lactation Tools Discussed/Used Pump Review: Setup, frequency, and cleaning;Milk Storage Initiated by:: JW Date initiated:: 09/24/13   Consult Status Consult Status: Follow-up Date: 09/25/13 Follow-up type: In-patient    Evelyn [name], Evelyn [name] 09/24/2013, 9:26 PM

## 2013-09-24 NOTE — Addendum Note (Signed)
Addendum created 09/24/13 1144 by Shanon PayorSuzanne M Gregory, CRNA   Modules edited: Charges VN, Notes Section   Notes Section:  File: [id number]

## 2013-09-24 NOTE — Progress Notes (Signed)
CTSP re: low UO  Patient feels a lot better eating bagels. Ambulating OK. Good pain relief  Filed Vitals:   09/24/13 1622  BP: 174/81  Pulse: 97  Temp: 98.4 F (36.9 C)  Resp: 20  (Labetalol did not carry over to post op orders. Labetalol has now been restarted)  UO foley bag just emptied of about 50cc, urine in bag light brown  Abd soft with good BS, dressing C&D  Results for orders placed during the hospital encounter of 09/22/13 (from the past 24 hour(s))  GLUCOSE, CAPILLARY     Status: None   Collection Time    09/23/13  6:58 PM      Result Value Ref Range   Glucose-Capillary 93  70 - 99 mg/dL  CBC     Status: Abnormal   Collection Time    09/23/13  7:00 PM      Result Value Ref Range   WBC 17.8 (*) 4.0 - 10.5 K/uL   RBC 3.45 (*) 3.87 - 5.11 MIL/uL   Hemoglobin 11.3 (*) 12.0 - 15.0 g/dL   HCT 91.433.4 (*) 78.236.0 - 95.646.0 %   MCV 96.8  78.0 - 100.0 fL   MCH 32.8  26.0 - 34.0 pg   MCHC 33.8  30.0 - 36.0 g/dL   RDW 21.315.1  08.611.5 - 57.815.5 %   Platelets 234  150 - 400 K/uL  GLUCOSE, CAPILLARY     Status: Abnormal   Collection Time    09/23/13  8:09 PM      Result Value Ref Range   Glucose-Capillary 106 (*) 70 - 99 mg/dL  CBC     Status: Abnormal   Collection Time    09/24/13  6:00 AM      Result Value Ref Range   WBC 13.0 (*) 4.0 - 10.5 K/uL   RBC 3.26 (*) 3.87 - 5.11 MIL/uL   Hemoglobin 10.6 (*) 12.0 - 15.0 g/dL   HCT 46.931.8 (*) 62.936.0 - 52.846.0 %   MCV 97.5  78.0 - 100.0 fL   MCH 32.5  26.0 - 34.0 pg   MCHC 33.3  30.0 - 36.0 g/dL   RDW 41.315.3  24.411.5 - 01.015.5 %   Platelets 221  150 - 400 K/uL  COMPREHENSIVE METABOLIC PANEL     Status: Abnormal   Collection Time    09/24/13  9:45 AM      Result Value Ref Range   Sodium 137  137 - 147 mEq/L   Potassium 4.0  3.7 - 5.3 mEq/L   Chloride 102  96 - 112 mEq/L   CO2 25  19 - 32 mEq/L   Glucose, Bld 162 (*) 70 - 99 mg/dL   BUN 7  6 - 23 mg/dL   Creatinine, Ser 2.720.49 (*) 0.50 - 1.10 mg/dL   Calcium 9.1  8.4 - 53.610.5 mg/dL   Total Protein  5.8 (*) 6.0 - 8.3 g/dL   Albumin 2.2 (*) 3.5 - 5.2 g/dL   AST 21  0 - 37 U/L   ALT 15  0 - 35 U/L   Alkaline Phosphatase 83  39 - 117 U/L   Total Bilirubin 0.3  0.3 - 1.2 mg/dL   GFR calc non Af Amer >90  >90 mL/min   GFR calc Af Amer >90  >90 mL/min  COMPREHENSIVE METABOLIC PANEL     Status: Abnormal   Collection Time    09/24/13  4:27 PM      Result Value Ref Range  Sodium 139  137 - 147 mEq/L   Potassium 4.1  3.7 - 5.3 mEq/L   Chloride 102  96 - 112 mEq/L   CO2 26  19 - 32 mEq/L   Glucose, Bld 136 (*) 70 - 99 mg/dL   BUN 7  6 - 23 mg/dL   Creatinine, Ser 1.610.54  0.50 - 1.10 mg/dL   Calcium 9.2  8.4 - 09.610.5 mg/dL   Total Protein 5.0 (*) 6.0 - 8.3 g/dL   Albumin 2.0 (*) 3.5 - 5.2 g/dL   AST 20  0 - 37 U/L   ALT 14  0 - 35 U/L   Alkaline Phosphatase 80  39 - 117 U/L   Total Bilirubin 0.3  0.3 - 1.2 mg/dL   GFR calc non Af Amer >90  >90 mL/min   GFR calc Af Amer >90  >90 mL/min  CBC     Status: Abnormal   Collection Time    09/24/13  4:27 PM      Result Value Ref Range   WBC 13.5 (*) 4.0 - 10.5 K/uL   RBC 3.08 (*) 3.87 - 5.11 MIL/uL   Hemoglobin 9.9 (*) 12.0 - 15.0 g/dL   HCT 04.530.2 (*) 40.936.0 - 81.146.0 %   MCV 98.1  78.0 - 100.0 fL   MCH 32.1  26.0 - 34.0 pg   MCHC 32.8  30.0 - 36.0 g/dL   RDW 91.415.5  78.211.5 - 95.615.5 %   Platelets 212  150 - 400 K/uL   A/P: CHTN - will resume Labetalol          Low UO-creatinine stable, N/V last pm, prob dehydration                       -IV fluids, continue foley

## 2013-09-24 NOTE — Anesthesia Postprocedure Evaluation (Signed)
  Anesthesia Post-op Note  Patient: Evelyn [name]  Procedure(s) Performed: Procedure(s): CESAREAN SECTION (N/A)  Patient Location: PACU  Anesthesia Type:Epidural  Level of Consciousness: awake and alert   Airway and Oxygen Therapy: Patient Spontanous Breathing  Post-op Pain: none  Post-op Assessment: Post-op Vital signs reviewed, Patient's Cardiovascular Status Stable, Respiratory Function Stable, Patent Airway, No signs of Nausea or vomiting and Pain level controlled  Post-op Vital Signs: Reviewed and stable  Last Vitals:  Filed Vitals:   09/23/13 2247  BP: 142/93  Pulse: 75  Temp:   Resp: 16    Complications: No apparent anesthesia complications

## 2013-09-24 NOTE — Progress Notes (Signed)
N/V through night, has felt better about last hour-asking about food, no flatus  Filed Vitals:   09/24/13 0630  BP: 128/78  Pulse: 98  Temp: 97.7 F (36.5 C)  Resp: 20   UO concentrated  Lungs CTA  Cor RRR Abd soft, BS +, dressing C&D Ext PAS on  A: Stable  P: IV fluid bolus     CMET     Continue Labetalol     IV protonix

## 2013-09-25 ENCOUNTER — Encounter (HOSPITAL_COMMUNITY): Payer: Self-pay | Admitting: Obstetrics and Gynecology

## 2013-09-25 NOTE — Plan of Care (Signed)
Problem: Discharge Progression Outcomes Goal: Remove staples per MD order Outcome: Not Applicable Date Met:  90/30/09 Pt has PICO negative wound vac and sutures and steri strips.

## 2013-09-25 NOTE — Lactation Note (Signed)
This note was copied from the chart of Evelyn [name]. Lactation Consultation Note Baby breastfeeding on the left side, for about 5 minutes, then asleep. When waking techniques were attempted, baby started crying. Mom wanted to give a small bottle of formula and attempt to pump. Reviewed pumping with mom, and answered questions.  Enc mom to call if she has any other concerns.   Patient Name: Evelyn Evelyn [name] ZOXWR'UToday's Date: 09/25/2013 Reason for consult: Follow-up assessment   Maternal Data    Feeding Feeding Type: Breast Fed  LATCH Score/Interventions Latch: Repeated attempts needed to sustain latch, nipple held in mouth throughout feeding, stimulation needed to elicit sucking reflex. Intervention(s): Adjust position;Assist with latch;Breast compression  Audible Swallowing: A few with stimulation  Type of Nipple: Everted at rest and after stimulation  Comfort (Breast/Nipple): Soft / non-tender     Hold (Positioning): No assistance needed to correctly position infant at breast.  LATCH Score: 8  Lactation Tools Discussed/Used     Consult Status Consult Status: Follow-up Follow-up type: In-patient    Octavio MannsSanders, [facility] 09/25/2013, 3:05 PM

## 2013-09-25 NOTE — Progress Notes (Signed)
Subjective: Postpartum Day 2: Cesarean Delivery Patient reports tolerating PO and no problems voiding.  Nausea resolved. Some soreness at incisional site. Denies HA or visual disturbance  Objective: Vital signs in last 24 hours: Temp:  [98.1 F (36.7 C)-98.5 F (36.9 C)] 98.3 F (36.8 C) (06/22 0559) Pulse Rate:  [84-98] 91 (06/22 0559) Resp:  [16-20] 18 (06/22 0559) BP: (121-174)/(74-90) 133/76 mmHg (06/22 0559) SpO2:  [96 %-97 %] 97 % (06/21 1622)  Physical Exam:  General: alert and cooperative Lochia: appropriate Uterine Fundus: firm Incision: PICO dressing with scant drainage noted on bandage DVT Evaluation: No evidence of DVT seen on physical exam. Negative Homan's sign. No cords or calf tenderness. Calf/Ankle edema is present. DTR's 1-2+, no RUQ tenderness  Recent Labs  09/24/13 0600 09/24/13 1627  HGB 10.6* 9.9*  HCT 31.8* 30.2*    Assessment/Plan: Status post Cesarean section. Postoperative course complicated by [facility] and GDM  Continue current care.  CURTIS,CAROL G 09/25/2013, 8:12 AM

## 2013-09-26 LAB — GLUCOSE, CAPILLARY: GLUCOSE-CAPILLARY: 110 mg/dL — AB (ref 70–99)

## 2013-09-26 MED ORDER — LABETALOL HCL 200 MG PO TABS
200.0000 mg | ORAL_TABLET | Freq: Three times a day (TID) | ORAL | Status: DC
  Administered 2013-09-26 (×3): 200 mg via ORAL
  Filled 2013-09-26 (×3): qty 1

## 2013-09-26 MED ORDER — CEPHALEXIN 500 MG PO CAPS
500.0000 mg | ORAL_CAPSULE | Freq: Four times a day (QID) | ORAL | Status: DC
  Administered 2013-09-26 – 2013-09-27 (×4): 500 mg via ORAL
  Filled 2013-09-26 (×5): qty 1

## 2013-09-26 NOTE — Progress Notes (Signed)
Subjective: Postpartum Day *3**: Cesarean Delivery Patient reports incisional pain, tolerating PO, + flatus and no problems voiding.    Objective: Vital signs in last 24 hours: Temp:  [97.9 F (36.6 C)-98.2 F (36.8 C)] 97.9 F (36.6 C) (06/23 0609) Pulse Rate:  [82-104] 90 (06/23 0609) Resp:  [18-19] 18 (06/23 0609) BP: (145-184)/(74-92) 169/92 mmHg (06/23 16100609)  Physical Exam:  General: alert and cooperative Lochia: appropriate Uterine Fundus: firm Incision: erythema noted superior and inferior to abd dressing  DVT Evaluation: No evidence of DVT seen on physical exam. Negative Homan's sign. No cords or calf tenderness. Calf/Ankle edema is present.   Recent Labs  09/24/13 0600 09/24/13 1627  HGB 10.6* 9.9*  HCT 31.8* 30.2*    Assessment/Plan: Status post Cesarean section. Postoperative course [facility] by [facility] and GDM  Keflex 500 mg qid and increase Labetalol to 200 mg tid.   check FBS this am .  CURTIS,CAROL G 09/26/2013, 8:09 AM

## 2013-09-26 NOTE — Progress Notes (Signed)
Upon assesseing pt this am Dr. Rana SnareLowe request that pt PICO be discontinued. Told MD I would refer to my resource to assure that I can that procedure. PICO dressing removed, no distress noted. No bleeding at incision site noted.

## 2013-09-27 MED ORDER — MEASLES, MUMPS & RUBELLA VAC ~~LOC~~ INJ
0.5000 mL | INJECTION | Freq: Once | SUBCUTANEOUS | Status: AC
  Administered 2013-09-27: 0.5 mL via SUBCUTANEOUS
  Filled 2013-09-27 (×2): qty 0.5

## 2013-09-27 MED ORDER — CEPHALEXIN 500 MG PO CAPS: 500.0000 mg | ORAL_CAPSULE | Freq: Four times a day (QID) | ORAL | Status: DC

## 2013-09-27 MED ORDER — HYDROMORPHONE HCL 2 MG PO TABS: 2.0000 mg | ORAL_TABLET | ORAL | Status: DC | PRN

## 2013-09-27 MED ORDER — LABETALOL HCL 200 MG PO TABS
200.0000 mg | ORAL_TABLET | Freq: Three times a day (TID) | ORAL | Status: DC
  Administered 2013-09-27: 200 mg via ORAL
  Filled 2013-09-27: qty 1

## 2013-09-27 MED ORDER — LABETALOL HCL 200 MG PO TABS: 200.0000 mg | ORAL_TABLET | Freq: Three times a day (TID) | ORAL | Status: AC

## 2013-09-27 MED ORDER — IBUPROFEN 600 MG PO TABS: 600.0000 mg | ORAL_TABLET | Freq: Four times a day (QID) | ORAL | Status: DC

## 2013-09-27 NOTE — Discharge Summary (Signed)
Obstetric Discharge Summary Reason for Admission: observation/evaluation Prenatal Procedures: ultrasound Intrapartum Procedures: cesarean: low cervical, transverse Postpartum Procedures: none Complications-Operative and Postpartum: incisional cellulitis Hemoglobin  Date Value Ref Range Status  09/24/2013 9.9* 12.0 - 15.0 g/dL Final     HCT  Date Value Ref Range Status  09/24/2013 30.2* 36.0 - 46.0 % Final    Physical Exam:  General: alert and cooperative Lochia: appropriate Uterine Fundus: firm Incision: erythema improving no draiange DVT Evaluation: No evidence of DVT seen on physical exam. Negative Homan's sign. No cords or calf tenderness. Calf/Ankle edema is present.  Discharge Diagnoses: Term Pregnancy-delivered  Discharge Information: Date: 09/27/2013 Activity: pelvic rest Diet: modified low carb Medications: PNV, Ibuprofen and labetalol and dilaudid Condition: stable Instructions: refer to practice specific booklet Discharge to: home   Newborn Data: Live born female  Birth Weight: 6 lb 12.3 oz (3070 g) APGAR: 7, 9  Home with mother.  CURTIS,CAROL G 09/27/2013, 8:15 AM

## 2013-09-27 NOTE — Progress Notes (Signed)
Subjective: Postpartum Day 4: Cesarean Delivery Patient reports tolerating PO, + flatus, + BM and no problems voiding.    Objective: Vital signs in last 24 hours: Temp:  [97.7 F (36.5 C)-97.9 F (36.6 C)] 97.7 F (36.5 C) (06/24 0600) Pulse Rate:  [74-101] 80 (06/24 0650) Resp:  [18] 18 (06/24 0600) BP: (150-194)/(86-104) 177/88 mmHg (06/24 0650)  Physical Exam:  General: alert and cooperative Lochia: appropriate Uterine Fundus: firm Incision: PICO dressing removed yesterday and honeycomb dressing applied. Erythema improving. No drainage. Small blistered area covered with occlusive dressing DVT Evaluation: No evidence of DVT seen on physical exam. Negative Homan's sign. No cords or calf tenderness. Calf/Ankle edema is present.   Recent Labs  09/24/13 1627  HGB 9.9*  HCT 30.2*    Assessment/Plan: Status post Cesarean section. [facility] and [facility]  Discharge home with standard precautions and return to clinic in 1 week  PIH precautions reviewed.  CURTIS,CAROL G 09/27/2013, 8:07 AM

## 2013-09-27 NOTE — Progress Notes (Signed)
Post discharge chart review completed.  

## 2013-10-04 ENCOUNTER — Ambulatory Visit: Payer: Self-pay

## 2013-10-04 NOTE — Lactation Note (Signed)
This note was copied from the chart of Evelyn [name]. Lactation Consult  Mother's reason for visit: help with latching on , and baby being consistent at the breast  Visit Type:  Feeding assessment  Appointment Notes:  Mom has a history of infertility , chronic high B/P , Baby is a 37 weeks at birth  Consult:  Initial Lactation Consultant:  Kathrin GreathouseIorio, Sandie Swayze Ann  ________________________________________________________________________  Birth weight - 6-12 oz  6/25 - 6-4 oz  6/29 - 6-8 oz  Today's weight - 6-8.6 oz 2966 g   Per mom fed the baby at 0715 40 ml of EBM  Is pumping after every feeding at the breast with a nipple shield #20    ________________________________________________________________________  Mother's Name: Evelyn [name] Type of delivery:  C-Section, Low Transverse Breastfeeding Experience:  1st baby  Maternal Medical Conditions:  Pregnancy induced hypertension and Infertility, also per mom - had high B/P prior to pregnancy  Maternal Medications:  B/P med - Labetalol ( and probably will continue it - per mom                                      PNV                                       LC recommended due to hx - Herbs for breast feeding would be indicated - Mother's Love ( blessed thistle , goats Rue, Fenugreek )   ________________________________________________________________________  Breastfeeding History (Post Discharge)  Frequency of breastfeeding:  Every 2-3 hours  Duration of feeding:  15 mins , and noted to be hanging out at times   Supplementation  Formula:  Volume 40  Frequency:  When difficult latching with nipple shield        Brand: Similac  Breastmilk:  Volume 40 ml Frequency: today   Method:  Bottle,   Pumping  Type of pump:  Ameda Frequency: after every feeding for 10 -15 mins  Volume:  10 -25 ml ( sometimes more   Infant Intake and Output Assessment  Voids:  6-8 in 24 hrs.  Color:  Clear yellow Stools:  2-3  in 24 hrs.   Color:  Green and Yellow  ________________________________________________________________________  Maternal Breast Assessment  Breast:  Filling Nipple:  Erect Pain level:  0 Pain interventions:  Expressed breast milk  _______________________________________________________________________ Feeding Assessment/Evaluation  Initial feeding assessment:  Infant's oral assessment:  WNL  Positioning:  Football Right breast  LATCH documentation: ( applied #20 Nipple shield , noted it to be tight fitting at the base , increased to #24 NS and better it for the base of the nipple   Latch:  1 = Repeated attempts needed to sustain latch, nipple held in mouth throughout feeding, stimulation needed to elicit sucking reflex.  Audible swallowing:  2 = Spontaneous and intermittent added formula in the nipple shield with a syringe and then a SNS   Type of nipple:  2 = Everted at rest and after stimulation  Comfort (Breast/Nipple):  2 = Soft / non-tender  Hold (Positioning):  1 = Assistance needed to correctly position infant at breast and maintain latch  LATCH score:  8   Attached assessment:  Shallow at 1st , and worked on easing baby's chin down for  A deeper latch   Lips flanged:  Yes.  with assist   Lips untucked:  No.  Suck assessment:  Nutritive  Tools:  Curved tip syringe, SNS and #24 Nipple shield  Instructed on use and cleaning of tool:  Yes.    Pre-feed weight:  2966 g  (6 lb. 8.6  oz.) Post-feed weight: 2986  g (6 lb. 9.3  oz.) Amount transferred:  20  Ml ( 5 of breast milk and 15 ml of formula  Amount supplemented:  15 ml  No  Total amount pumped post feed:  R 5  ml    L did not latch   Total amount transferred: 5  ml Total supplement given:  15  Ml  Lactation Plan of care - Goal - increase Evelyn [name] weight , feedings every 2-3  Hours and with feeding cues                                        - Option #1 - feeding at the breast with SNS 45-60 ml / post pump 10 -15 mins and  #24 NS                                         - Option #2 - Feeding with a bottle and pumping both breast 15 - 20 mins                                         - Change artifical nipple to Dr. Manson PasseyBrown . Medium based.                    Follow up appointment made for next Thursday July 9th with Lactation

## 2013-10-12 ENCOUNTER — Ambulatory Visit (HOSPITAL_COMMUNITY)
Admission: RE | Admit: 2013-10-12 | Discharge: 2013-10-12 | Disposition: A | Discharge status: Home / Self Care | Payer: 59 | Source: Ambulatory Visit | Attending: Obstetrics and Gynecology | Admitting: Obstetrics and Gynecology

## 2013-10-12 NOTE — Lactation Note (Signed)
Lactation Consult  Mother's reason for visit:  Follow up  Visit Type: LC consult Appointment Notes: Mother is following up for evaluation of milk supply and a feeding assessment Consult:  Follow-Up Lactation Consultant:  Michel Bickers  ________________________________________________________________________ Birth weight - 6-12 oz  6/25 - 6-4 oz  6/29 - 6-8 oz  Today's weight - 6-8.6 oz 2966 g       ________________________________________________________________________  Mother's Name: Evelyn [name] Type of delivery:  C-Section Breastfeeding Experience:  none Maternal Medical Conditions:  Chronic hypertension, history of infertility Maternal Medications:  Labetalol, Mothers Love  ________________________________________________________________________  Breastfeeding History (Post Discharge)  Frequency of breastfeeding:  2-3 hrs Duration of feeding:  10 mins  Supplementation  Formula:  Volume 65-75 ml Frequency:  2-3 hours       Brand: Similac  Breastmilk:  20-25 Volume ml, every 2-3 hours Frequency:  2 times daily mother gives 2.5 ounces of EBM  Method:  Bottle,   Pumping  Type of pump:  Ameda Frequency:  8 x for 15-20 mins Volume:  20-25 ml  Infant Intake and Output Assessment  Voids:  8in 24 hrs.  Color:  Clear yellow Stools:  2 in 24 hrs.  Color:  Brown  ________________________________________________________________________  Maternal Breast Assessment  Breast:  Soft Nipple:  Erect Pain level:  0 Pain interventions:  Expressed breast milk  _______________________________________________________________________ Feeding Assessment/Evaluation :  Mother has wide space between her breast with IGT. Mother has not been consistently latching infant or using the SNS. She is using a #24 nipple shield. She has been breastfeeding for 10 mins one to two times daily. Mother has a difficult time with latching infant. Infant slides on and off with poor  latch. Infant rolls bottom lip upward and needed repeated adjustment for wider gape. Mothers breast are soft . She is making only 20-25 ml every 2-3 hours. She gives infant 2 bottles daily with 2.5 ounces of breastmilk. All other bottles are formula. Mother inquired about the use of Domperidone. She was given wedsite to get more information. She was referred to MD for advise. Mother is very determined to make as much milk as possible. Mother was given lots of praise for her tireless efforts. Advised to continue to try for a few more weeks before giving up.   Initial feeding assessment:  Infant's oral assessment:  WNL  Positioning:  Football Right breast  LATCH documentation:  Latch:  1 = Repeated attempts needed to sustain latch, nipple held in mouth throughout feeding, stimulation needed to elicit sucking reflex.  Audible swallowing:  1 = A few with stimulation  Type of nipple:  2 = Everted at rest and after stimulation  Comfort (Breast/Nipple):  2 = Soft / non-tender  Hold (Positioning):  1 = Assistance needed to correctly position infant at breast and maintain latch  LATCH score:  7  Attached assessment:  Shallow  Lips flanged:  No.  Lips untucked:  Yes.    Suck assessment:  Non nutritive sucking  Tools:  Nipple shield 24 mm Instructed on use and cleaning of tool:  Yes.    Pre-feed weight:  3200 g  (7lb. 0.9 oz.) Post-feed weight:  3202 g (7 lb. 1 oz.) Amount transferred:  2 ml Amount supplemented:  2 ml given through the nipple shield  Additional Feeding Assessment - Father gave infant 2.5 ounces of formula with a bottle Mother had concerns about her flange size. She pumped one breast . We tried to use the #  30 flange that was too large. Mother to continue to use the #24.  Total amount pumped post feed:  R none  ml    L 10 ml  Total amount transferred:  none ml Total supplement given:  2.5 ounces ml   Advised mother to begin offering breast more frequently  Use the SNS for  several feedings a day Give infant at least 2.5-3 ounces of formula every 2-3 hours  Continue to given infant any amts of ebm that she can pump Mother to continue to post pump 6-8 times daily for 20 mins each breast Tips for breast massage and hands on pumping Follow up for weight check with Peds on Monday Mother to continue her supplements [facility]

## 2014-02-05 ENCOUNTER — Encounter (HOSPITAL_COMMUNITY): Payer: Self-pay | Admitting: Obstetrics and Gynecology

## 2016-07-27 DIAGNOSIS — R7301 Impaired fasting glucose: Secondary | ICD-10-CM | POA: Diagnosis not present

## 2016-07-27 DIAGNOSIS — I10 Essential (primary) hypertension: Secondary | ICD-10-CM | POA: Diagnosis not present

## 2016-07-27 DIAGNOSIS — E7800 Pure hypercholesterolemia, unspecified: Secondary | ICD-10-CM | POA: Diagnosis not present

## 2016-08-12 DIAGNOSIS — J019 Acute sinusitis, unspecified: Secondary | ICD-10-CM | POA: Diagnosis not present

## 2017-01-01 DIAGNOSIS — Z23 Encounter for immunization: Secondary | ICD-10-CM | POA: Diagnosis not present

## 2017-01-25 DIAGNOSIS — J209 Acute bronchitis, unspecified: Secondary | ICD-10-CM | POA: Diagnosis not present

## 2017-02-15 DIAGNOSIS — H10413 Chronic giant papillary conjunctivitis, bilateral: Secondary | ICD-10-CM | POA: Diagnosis not present

## 2017-04-13 DIAGNOSIS — Z01419 Encounter for gynecological examination (general) (routine) without abnormal findings: Secondary | ICD-10-CM | POA: Diagnosis not present

## 2017-04-13 DIAGNOSIS — Z1231 Encounter for screening mammogram for malignant neoplasm of breast: Secondary | ICD-10-CM | POA: Diagnosis not present

## 2017-04-13 DIAGNOSIS — Z6841 Body Mass Index (BMI) 40.0 and over, adult: Secondary | ICD-10-CM | POA: Diagnosis not present

## 2017-05-05 DIAGNOSIS — R51 Headache: Secondary | ICD-10-CM | POA: Diagnosis not present

## 2017-06-23 DIAGNOSIS — R7303 Prediabetes: Secondary | ICD-10-CM | POA: Diagnosis not present

## 2017-06-23 DIAGNOSIS — I10 Essential (primary) hypertension: Secondary | ICD-10-CM | POA: Diagnosis not present

## 2017-06-23 DIAGNOSIS — E7800 Pure hypercholesterolemia, unspecified: Secondary | ICD-10-CM | POA: Diagnosis not present

## 2017-07-06 DIAGNOSIS — J209 Acute bronchitis, unspecified: Secondary | ICD-10-CM | POA: Diagnosis not present

## 2017-07-06 DIAGNOSIS — J329 Chronic sinusitis, unspecified: Secondary | ICD-10-CM | POA: Diagnosis not present

## 2017-07-06 DIAGNOSIS — J309 Allergic rhinitis, unspecified: Secondary | ICD-10-CM | POA: Diagnosis not present

## 2017-08-18 DIAGNOSIS — J45909 Unspecified asthma, uncomplicated: Secondary | ICD-10-CM | POA: Diagnosis not present

## 2017-08-18 DIAGNOSIS — J309 Allergic rhinitis, unspecified: Secondary | ICD-10-CM | POA: Diagnosis not present

## 2018-01-21 DIAGNOSIS — Z23 Encounter for immunization: Secondary | ICD-10-CM | POA: Diagnosis not present

## 2018-01-26 DIAGNOSIS — E7800 Pure hypercholesterolemia, unspecified: Secondary | ICD-10-CM | POA: Diagnosis not present

## 2018-01-26 DIAGNOSIS — I10 Essential (primary) hypertension: Secondary | ICD-10-CM | POA: Diagnosis not present

## 2018-02-14 DIAGNOSIS — J988 Other specified respiratory disorders: Secondary | ICD-10-CM | POA: Diagnosis not present

## 2018-02-21 DIAGNOSIS — R51 Headache: Secondary | ICD-10-CM | POA: Diagnosis not present

## 2018-03-21 DIAGNOSIS — I10 Essential (primary) hypertension: Secondary | ICD-10-CM | POA: Diagnosis not present

## 2018-04-03 ENCOUNTER — Encounter (HOSPITAL_COMMUNITY): Payer: Self-pay | Admitting: Emergency Medicine

## 2018-04-03 ENCOUNTER — Emergency Department (HOSPITAL_COMMUNITY)
Admission: EM | Admit: 2018-04-03 | Discharge: 2018-04-04 | Disposition: A | Discharge status: Home / Self Care | Payer: 59 | Attending: Emergency Medicine | Admitting: Emergency Medicine

## 2018-04-03 ENCOUNTER — Other Ambulatory Visit: Payer: Self-pay

## 2018-04-03 DIAGNOSIS — I10 Essential (primary) hypertension: Secondary | ICD-10-CM | POA: Insufficient documentation

## 2018-04-03 DIAGNOSIS — J45909 Unspecified asthma, uncomplicated: Secondary | ICD-10-CM | POA: Insufficient documentation

## 2018-04-03 DIAGNOSIS — Z79899 Other long term (current) drug therapy: Secondary | ICD-10-CM | POA: Diagnosis not present

## 2018-04-03 NOTE — ED Triage Notes (Addendum)
Reports she has been monitoring Blood pressure at home and it was 192/113 tonight.  Denies any symptoms.  Pt takes Labetalol 100 mg twice daily.  Took 1 extra pill today due to elevated BP.

## 2018-04-04 DIAGNOSIS — I10 Essential (primary) hypertension: Secondary | ICD-10-CM | POA: Diagnosis not present

## 2018-04-04 NOTE — ED Provider Notes (Signed)
[facility]   CSN: [id number] Arrival date & time: 04/03/18  2231     History   Chief Complaint Chief Complaint  Patient presents with  . Hypertension    HPI Evelyn [name] is a 47 y.o. female.  The history is provided by the patient and the spouse.  Hypertension  This is a new problem. The current episode started 12 to 24 hours ago. The problem occurs constantly. The problem has been gradually worsening. Pertinent negatives include no chest pain, no abdominal pain, no headaches and no shortness of breath. Nothing aggravates the symptoms. Nothing relieves the symptoms. Treatments tried: extra labetalol. The treatment provided no relief.  Patient presents with worsening hypertension.  She reports she takes labetalol every day.  Over the past days she has noted increasing blood pressure.  She took an extra labetalol without improvement.  She denies headache/chest pain/shortness of breath.  No focal weakness.  No drug abuse, no excessive caffeine use. She Has PCP follow-up later today  Past Medical History:  Diagnosis Date  . Arthritis   . Asthma    allergy  . Gestational diabetes   . Hypertension     Patient Active Problem List   Diagnosis Date Noted  . Hypertension in pregnancy 09/22/2013  . Short cervix in second trimester, antepartum 05/30/2013    Past Surgical History:  Procedure Laterality Date  . ARTHROSCOPIC REPAIR PCL Bilateral 1997  . CERVICAL CERCLAGE N/A 05/31/2013   Procedure: CERCLAGE CERVICAL;  Surgeon: Zelphia CairoGretchen Adkins, MD;  Location: [facility];  Service: Gynecology;  Laterality: N/A;  . CESAREAN SECTION N/A 09/23/2013   Procedure: CESAREAN SECTION;  Surgeon: Leslie AndreaJames E Tomblin II, MD;  Location: [facility];  Service: Obstetrics;  Laterality: N/A;  . CHOLECYSTECTOMY, LAPAROSCOPIC Bilateral 1997  . FRACTURE SURGERY     ORIF Humerus     OB History    Gravida  2   Para  1   Term  1   Preterm      AB  1     Living  1     SAB  1   TAB      Ectopic      Multiple      Live Births  1            Home Medications    Prior to Admission medications   Medication Sig Start Date End Date Taking? Authorizing Provider  acetaminophen (TYLENOL) 325 MG tablet Take 650 mg by mouth every 6 (six) hours as needed for mild pain.    Yes [provider]  albuterol (PROVENTIL HFA;VENTOLIN HFA) 108 (90 BASE) MCG/ACT inhaler Inhale 1-2 puffs into the lungs every 6 (six) hours as needed for wheezing or shortness of breath.   Yes [provider]  amoxicillin-clavulanate (AUGMENTIN) 875-125 MG tablet Take 1 tablet by mouth 2 (two) times daily.   Yes [provider]  fluticasone (FLONASE) 50 MCG/ACT nasal spray Place 1 spray into both nostrils daily.   Yes [provider]  labetalol (NORMODYNE) 100 MG tablet Take 100 mg by mouth 2 (two) times daily.   Yes [provider]  Multiple Vitamin (MULTIVITAMIN WITH MINERALS) TABS tablet Take 1 tablet by mouth daily.   Yes [provider]  docusate sodium 100 MG CAPS Take 100 mg by mouth 2 (two) times daily. Patient not taking: Reported on 04/04/2018 06/01/13   Mitchel HonourMorris, Megan, DO  labetalol (NORMODYNE) 200 MG tablet Take 1 tablet (  200 mg total) by mouth every 8 (eight) hours. Patient not taking: Reported on 04/04/2018 09/27/13   Julio Sicksurtis, Carol, NP    Family History Family History  Problem Relation Age of Onset  . Arthritis Mother   . Diabetes Mother   . Hypertension Mother   . Miscarriages / [place] Mother   . Heart disease Father   . Hyperlipidemia Father   . Diabetes Sister   . Hyperlipidemia Sister   . Hypertension Sister   . Diabetes Maternal Grandmother   . Hyperlipidemia Maternal Grandmother   . Miscarriages / Stillbirths Maternal Grandmother   . Parkinson's disease Maternal Grandmother   . Hyperlipidemia Maternal Grandfather   . Stroke Maternal Grandfather   . Heart disease Paternal  Grandmother     Social History Social History   Tobacco Use  . Smoking status: Never Smoker  . Smokeless tobacco: Never Used  Substance Use Topics  . Alcohol use: No    Comment: occ. none with preg  . Drug use: Never     Allergies   Codeine   Review of Systems Review of Systems  Constitutional: Negative for fatigue and fever.  Eyes: Negative for visual disturbance.  Respiratory: Negative for shortness of breath.   Cardiovascular: Negative for chest pain.  Gastrointestinal: Negative for abdominal pain.  Neurological: Negative for dizziness, weakness and headaches.  Psychiatric/Behavioral: The patient is nervous/anxious.   All other systems reviewed and are negative.    Physical Exam Updated Vital Signs BP (!) 148/84 (BP Location: Right Arm)   Pulse 66   Temp 98.3 F (36.8 C) (Oral)   Resp 16   LMP 03/24/2018   SpO2 98%   Physical Exam CONSTITUTIONAL: Well developed/well nourished HEAD: Normocephalic/atraumatic EYES: EOMI/PERRL, no nystagmus, no ptosis ENMT: Mucous membranes moist NECK: supple no meningeal signs CV: S1/S2 noted, no murmurs/rubs/gallops noted LUNGS: Lungs are clear to auscultation bilaterally, no apparent distress ABDOMEN: soft, nontender, no rebound or guarding GU:no cva tenderness NEURO:Awake/alert, face symmetric, no arm or leg drift is noted Equal 5/5 strength with shoulder abduction, elbow flex/extension Equal 5/5 strength with hip flexion,knee flex/extension, foot dorsi/plantar flexion Cranial nerves 3/4/5/6/10/12/08/11/12 tested and intact Gait normal without ataxia No past pointing Sensation to light touch intact in all extremities EXTREMITIES: pulses normal, full ROM SKIN: warm, color normal PSYCH: anxious  ED Treatments / Results  Labs (all labs ordered are listed, but only abnormal results are displayed) Labs Reviewed - No data to display  EKG EKG Interpretation  Date/Time:  Sunday April 03 2018 22:37:24  EST Ventricular Rate:  69 PR Interval:  170 QRS Duration: 82 QT Interval:  392 QTC Calculation: 420 R Axis:   37 Text Interpretation:  Normal sinus rhythm Low voltage QRS Cannot rule out Anterior infarct , age undetermined Abnormal ECG No significant change since last tracing Confirmed by Zadie RhineWickline, Ocie Tino ([id number]) on 04/04/2018 12:30:23 AM   Radiology No results found.  Procedures Procedures   Medications Ordered in ED Medications - No data to display   Initial Impression / Assessment and Plan / ED Course  I have reviewed the triage vital signs and the nursing notes.      Presents with asymptomatic hypertension.  It is spontaneously improving. She has PCP follow-up later today.  Imaging/labs not indicated.  We discussed strict return precautions  Final Clinical Impressions(s) / ED Diagnoses   Final diagnoses:  Essential hypertension    ED Discharge Orders    None       Zadie RhineWickline, Maggi Hershkowitz, MD 04/04/18  0109  

## 2018-04-08 DIAGNOSIS — E559 Vitamin D deficiency, unspecified: Secondary | ICD-10-CM | POA: Diagnosis not present

## 2018-04-08 DIAGNOSIS — E538 Deficiency of other specified B group vitamins: Secondary | ICD-10-CM | POA: Diagnosis not present

## 2018-04-08 DIAGNOSIS — E611 Iron deficiency: Secondary | ICD-10-CM | POA: Diagnosis not present

## 2018-04-08 DIAGNOSIS — R002 Palpitations: Secondary | ICD-10-CM | POA: Diagnosis not present

## 2018-04-08 DIAGNOSIS — Z5181 Encounter for therapeutic drug level monitoring: Secondary | ICD-10-CM | POA: Diagnosis not present

## 2018-04-14 DIAGNOSIS — Z1231 Encounter for screening mammogram for malignant neoplasm of breast: Secondary | ICD-10-CM | POA: Diagnosis not present

## 2018-04-14 DIAGNOSIS — Z6834 Body mass index (BMI) 34.0-34.9, adult: Secondary | ICD-10-CM | POA: Diagnosis not present

## 2018-04-14 DIAGNOSIS — Z01419 Encounter for gynecological examination (general) (routine) without abnormal findings: Secondary | ICD-10-CM | POA: Diagnosis not present

## 2018-04-21 DIAGNOSIS — I10 Essential (primary) hypertension: Secondary | ICD-10-CM | POA: Diagnosis not present

## 2018-04-21 DIAGNOSIS — R0789 Other chest pain: Secondary | ICD-10-CM | POA: Diagnosis not present

## 2018-04-28 DIAGNOSIS — I10 Essential (primary) hypertension: Secondary | ICD-10-CM | POA: Diagnosis not present

## 2018-05-09 DIAGNOSIS — I10 Essential (primary) hypertension: Secondary | ICD-10-CM | POA: Diagnosis not present

## 2018-05-20 DIAGNOSIS — I10 Essential (primary) hypertension: Secondary | ICD-10-CM | POA: Diagnosis not present

## 2018-05-20 DIAGNOSIS — R7309 Other abnormal glucose: Secondary | ICD-10-CM | POA: Diagnosis not present

## 2018-06-02 DIAGNOSIS — J019 Acute sinusitis, unspecified: Secondary | ICD-10-CM | POA: Diagnosis not present

## 2018-07-08 DIAGNOSIS — F410 Panic disorder [episodic paroxysmal anxiety] without agoraphobia: Secondary | ICD-10-CM | POA: Diagnosis not present

## 2018-07-28 DIAGNOSIS — I10 Essential (primary) hypertension: Secondary | ICD-10-CM | POA: Diagnosis not present

## 2018-07-28 DIAGNOSIS — F411 Generalized anxiety disorder: Secondary | ICD-10-CM | POA: Diagnosis not present

## 2018-07-29 DIAGNOSIS — F410 Panic disorder [episodic paroxysmal anxiety] without agoraphobia: Secondary | ICD-10-CM | POA: Diagnosis not present

## 2018-08-12 DIAGNOSIS — F410 Panic disorder [episodic paroxysmal anxiety] without agoraphobia: Secondary | ICD-10-CM | POA: Diagnosis not present

## 2018-08-30 DIAGNOSIS — F410 Panic disorder [episodic paroxysmal anxiety] without agoraphobia: Secondary | ICD-10-CM | POA: Diagnosis not present

## 2018-09-22 DIAGNOSIS — F410 Panic disorder [episodic paroxysmal anxiety] without agoraphobia: Secondary | ICD-10-CM | POA: Diagnosis not present

## 2018-11-03 DIAGNOSIS — F410 Panic disorder [episodic paroxysmal anxiety] without agoraphobia: Secondary | ICD-10-CM | POA: Diagnosis not present

## 2019-01-04 DIAGNOSIS — Z23 Encounter for immunization: Secondary | ICD-10-CM | POA: Diagnosis not present

## 2019-01-05 DIAGNOSIS — F410 Panic disorder [episodic paroxysmal anxiety] without agoraphobia: Secondary | ICD-10-CM | POA: Diagnosis not present

## 2019-01-24 DIAGNOSIS — F411 Generalized anxiety disorder: Secondary | ICD-10-CM | POA: Diagnosis not present

## 2019-01-24 DIAGNOSIS — I10 Essential (primary) hypertension: Secondary | ICD-10-CM | POA: Diagnosis not present

## 2019-01-24 DIAGNOSIS — R7303 Prediabetes: Secondary | ICD-10-CM | POA: Diagnosis not present

## 2019-01-24 DIAGNOSIS — E7800 Pure hypercholesterolemia, unspecified: Secondary | ICD-10-CM | POA: Diagnosis not present

## 2019-01-24 DIAGNOSIS — Z6841 Body Mass Index (BMI) 40.0 and over, adult: Secondary | ICD-10-CM | POA: Diagnosis not present

## 2019-02-06 DIAGNOSIS — F410 Panic disorder [episodic paroxysmal anxiety] without agoraphobia: Secondary | ICD-10-CM | POA: Diagnosis not present

## 2019-03-27 DIAGNOSIS — F410 Panic disorder [episodic paroxysmal anxiety] without agoraphobia: Secondary | ICD-10-CM | POA: Diagnosis not present

## 2019-04-17 DIAGNOSIS — F410 Panic disorder [episodic paroxysmal anxiety] without agoraphobia: Secondary | ICD-10-CM | POA: Diagnosis not present

## 2019-05-10 DIAGNOSIS — K649 Unspecified hemorrhoids: Secondary | ICD-10-CM | POA: Diagnosis not present

## 2019-05-10 DIAGNOSIS — K5900 Constipation, unspecified: Secondary | ICD-10-CM | POA: Diagnosis not present

## 2019-05-23 DIAGNOSIS — Z6841 Body Mass Index (BMI) 40.0 and over, adult: Secondary | ICD-10-CM | POA: Diagnosis not present

## 2019-05-23 DIAGNOSIS — Z01419 Encounter for gynecological examination (general) (routine) without abnormal findings: Secondary | ICD-10-CM | POA: Diagnosis not present

## 2019-05-23 DIAGNOSIS — Z1231 Encounter for screening mammogram for malignant neoplasm of breast: Secondary | ICD-10-CM | POA: Diagnosis not present

## 2019-06-06 DIAGNOSIS — F410 Panic disorder [episodic paroxysmal anxiety] without agoraphobia: Secondary | ICD-10-CM | POA: Diagnosis not present

## 2019-06-11 DIAGNOSIS — Z23 Encounter for immunization: Secondary | ICD-10-CM | POA: Diagnosis not present

## 2019-07-02 DIAGNOSIS — Z23 Encounter for immunization: Secondary | ICD-10-CM | POA: Diagnosis not present

## 2019-07-25 DIAGNOSIS — I10 Essential (primary) hypertension: Secondary | ICD-10-CM | POA: Diagnosis not present

## 2019-07-25 DIAGNOSIS — K921 Melena: Secondary | ICD-10-CM | POA: Diagnosis not present

## 2019-07-25 DIAGNOSIS — F410 Panic disorder [episodic paroxysmal anxiety] without agoraphobia: Secondary | ICD-10-CM | POA: Diagnosis not present

## 2019-07-25 DIAGNOSIS — F321 Major depressive disorder, single episode, moderate: Secondary | ICD-10-CM | POA: Diagnosis not present

## 2019-09-21 DIAGNOSIS — K5900 Constipation, unspecified: Secondary | ICD-10-CM | POA: Diagnosis not present

## 2019-09-21 DIAGNOSIS — K625 Hemorrhage of anus and rectum: Secondary | ICD-10-CM | POA: Diagnosis not present

## 2019-10-02 DIAGNOSIS — Z1159 Encounter for screening for other viral diseases: Secondary | ICD-10-CM | POA: Diagnosis not present

## 2019-10-05 DIAGNOSIS — K648 Other hemorrhoids: Secondary | ICD-10-CM | POA: Diagnosis not present

## 2019-10-05 DIAGNOSIS — K625 Hemorrhage of anus and rectum: Secondary | ICD-10-CM | POA: Diagnosis not present

## 2019-11-07 DIAGNOSIS — F410 Panic disorder [episodic paroxysmal anxiety] without agoraphobia: Secondary | ICD-10-CM | POA: Diagnosis not present

## 2019-12-12 DIAGNOSIS — Z20822 Contact with and (suspected) exposure to covid-19: Secondary | ICD-10-CM | POA: Diagnosis not present

## 2019-12-12 DIAGNOSIS — Z03818 Encounter for observation for suspected exposure to other biological agents ruled out: Secondary | ICD-10-CM | POA: Diagnosis not present

## 2019-12-14 DIAGNOSIS — J069 Acute upper respiratory infection, unspecified: Secondary | ICD-10-CM | POA: Diagnosis not present

## 2020-01-03 DIAGNOSIS — F410 Panic disorder [episodic paroxysmal anxiety] without agoraphobia: Secondary | ICD-10-CM | POA: Diagnosis not present

## 2020-01-12 DIAGNOSIS — Z23 Encounter for immunization: Secondary | ICD-10-CM | POA: Diagnosis not present

## 2020-01-17 DIAGNOSIS — J019 Acute sinusitis, unspecified: Secondary | ICD-10-CM | POA: Diagnosis not present

## 2020-01-30 DIAGNOSIS — I10 Essential (primary) hypertension: Secondary | ICD-10-CM | POA: Diagnosis not present

## 2020-01-30 DIAGNOSIS — E7800 Pure hypercholesterolemia, unspecified: Secondary | ICD-10-CM | POA: Diagnosis not present

## 2020-02-15 DIAGNOSIS — F410 Panic disorder [episodic paroxysmal anxiety] without agoraphobia: Secondary | ICD-10-CM | POA: Diagnosis not present

## 2020-02-26 DIAGNOSIS — M542 Cervicalgia: Secondary | ICD-10-CM | POA: Diagnosis not present

## 2020-04-29 DIAGNOSIS — J0110 Acute frontal sinusitis, unspecified: Secondary | ICD-10-CM | POA: Diagnosis not present

## 2020-08-20 ENCOUNTER — Other Ambulatory Visit (HOSPITAL_COMMUNITY): Payer: Self-pay | Admitting: Family Medicine

## 2020-09-03 ENCOUNTER — Other Ambulatory Visit: Payer: Self-pay

## 2020-09-03 ENCOUNTER — Ambulatory Visit (HOSPITAL_BASED_OUTPATIENT_CLINIC_OR_DEPARTMENT_OTHER)
Admission: RE | Admit: 2020-09-03 | Discharge: 2020-09-03 | Disposition: A | Discharge status: Home / Self Care | Payer: 59 | Source: Ambulatory Visit | Attending: Family Medicine | Admitting: Family Medicine

## 2020-09-03 DIAGNOSIS — E7800 Pure hypercholesterolemia, unspecified: Secondary | ICD-10-CM | POA: Insufficient documentation

## 2021-08-11 ENCOUNTER — Other Ambulatory Visit (HOSPITAL_COMMUNITY): Payer: Self-pay | Admitting: Family Medicine

## 2021-08-11 DIAGNOSIS — E7800 Pure hypercholesterolemia, unspecified: Secondary | ICD-10-CM

## 2021-08-14 ENCOUNTER — Other Ambulatory Visit: Payer: Self-pay | Admitting: Obstetrics and Gynecology

## 2021-08-14 DIAGNOSIS — R928 Other abnormal and inconclusive findings on diagnostic imaging of breast: Secondary | ICD-10-CM

## 2021-08-19 ENCOUNTER — Other Ambulatory Visit: Payer: Self-pay

## 2021-08-21 ENCOUNTER — Ambulatory Visit
Admission: RE | Admit: 2021-08-21 | Discharge: 2021-08-21 | Disposition: A | Discharge status: Home / Self Care | Payer: BC Managed Care – PPO | Source: Ambulatory Visit | Attending: Obstetrics and Gynecology | Admitting: Obstetrics and Gynecology

## 2021-08-21 ENCOUNTER — Ambulatory Visit: Payer: Self-pay

## 2021-08-21 DIAGNOSIS — R928 Other abnormal and inconclusive findings on diagnostic imaging of breast: Secondary | ICD-10-CM

## 2021-09-17 ENCOUNTER — Ambulatory Visit (HOSPITAL_COMMUNITY): Payer: Self-pay

## 2021-09-17 ENCOUNTER — Encounter (HOSPITAL_BASED_OUTPATIENT_CLINIC_OR_DEPARTMENT_OTHER): Payer: Self-pay

## 2021-09-17 ENCOUNTER — Ambulatory Visit (HOSPITAL_BASED_OUTPATIENT_CLINIC_OR_DEPARTMENT_OTHER)
Admission: RE | Admit: 2021-09-17 | Discharge: 2021-09-17 | Disposition: A | Discharge status: Home / Self Care | Payer: BC Managed Care – PPO | Source: Ambulatory Visit | Attending: Family Medicine | Admitting: Family Medicine

## 2021-09-17 DIAGNOSIS — E7800 Pure hypercholesterolemia, unspecified: Secondary | ICD-10-CM | POA: Insufficient documentation

## 2022-12-04 IMAGING — CT CT CARDIAC CORONARY ARTERY CALCIUM SCORE
4 series · 12 of 20 positions shown, 13 images · non-contrast
Comparison: Prior examination 09/03/2020
COMPARISON: Prior examination 09/03/2020

Addendum:
EXAM:
OVER-READ INTERPRETATION  CT CHEST

The following report is an over-read performed by radiologist Dr.
Keyli Tiger [REDACTED] on 09/17/2021. This
over-read does not include interpretation of cardiac or coronary
anatomy or pathology. The coronary calcium score interpretation by
the cardiologist is attached.
CLINICAL DATA: Cardiovascular Disease Risk stratification
Coronary Calcium Score
TECHNIQUE: A gated, non-contrast computed tomography scan of the heart was
performed using 3mm slice thickness. Axial images were analyzed on a
dedicated workstation. Calcium scoring of the coronary arteries was
performed using the Agatston method.

[Series 2: ax lung · axial · 0.86mm/px · z∈[+1328,+1410]mm · 4 of 69 slices shown]
[im 14/69  lung]
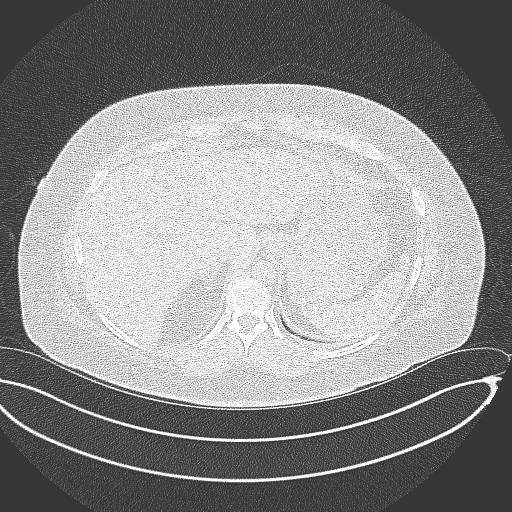
[im 28/69  lung]
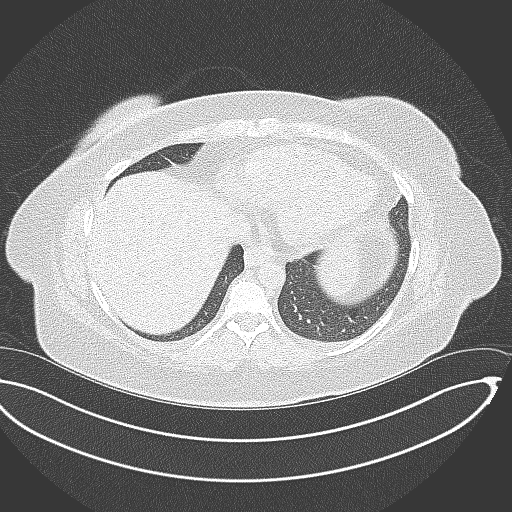
[im 41/69  lung]
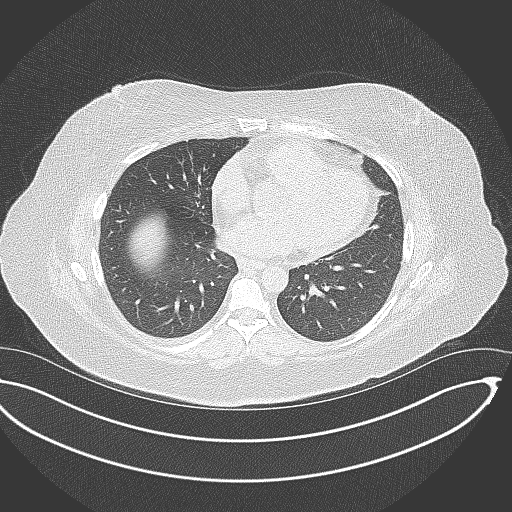
[im 55/69  lung]
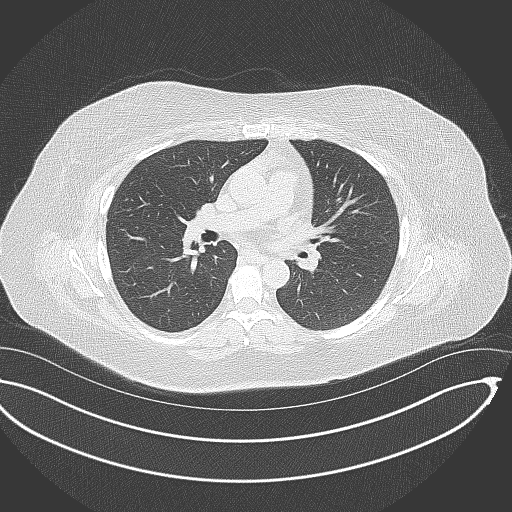

[Series 3: cascseq 3.0 sa36 70% (id) · axial · 0.39mm/px · z∈[+1347,+1392]mm · 2 of 46 slices shown]
[im 16/46  vessel]
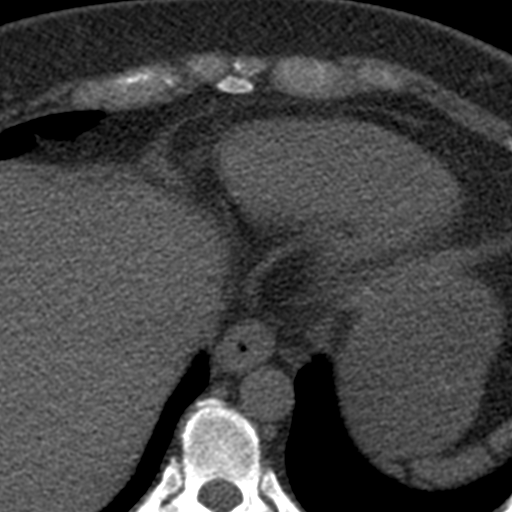
[im 31/46  vessel]
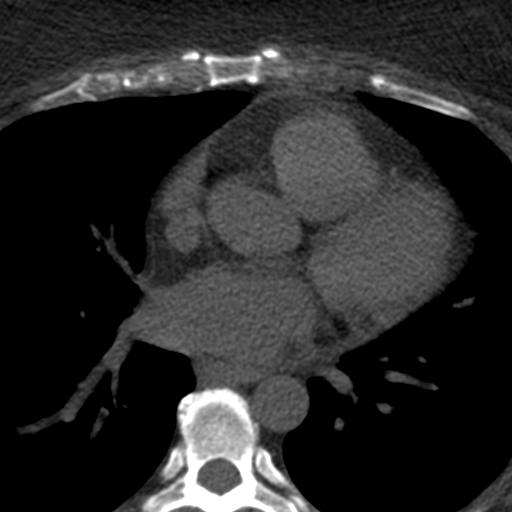

[Series 4: cascseq 3.0 sa36 70% 2 (id) · axial · 0.39mm/px · z∈[+1347,+1392]mm · 2 of 46 slices shown]
[im 16/46  vessel]
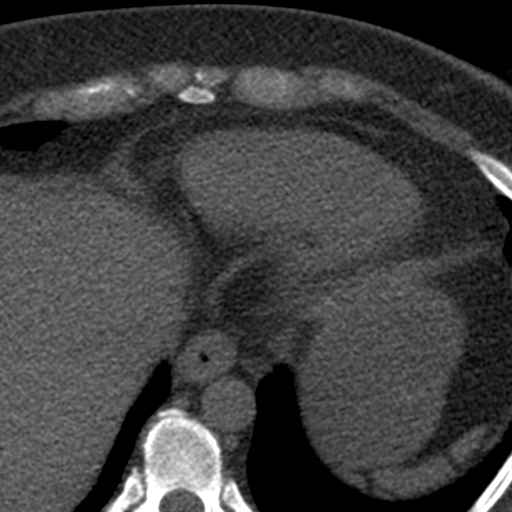
[im 31/46  vessel]
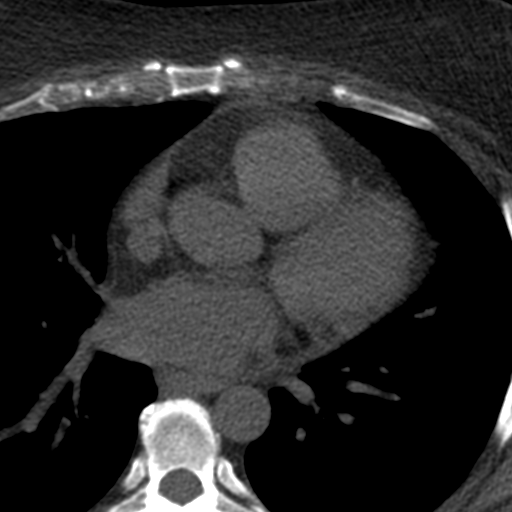

[Series 5: ax st · axial · 0.67mm/px · z∈[+1328,+1410]mm · 4 of 69 slices shown, 5 images]
[im 14/69  vessel]
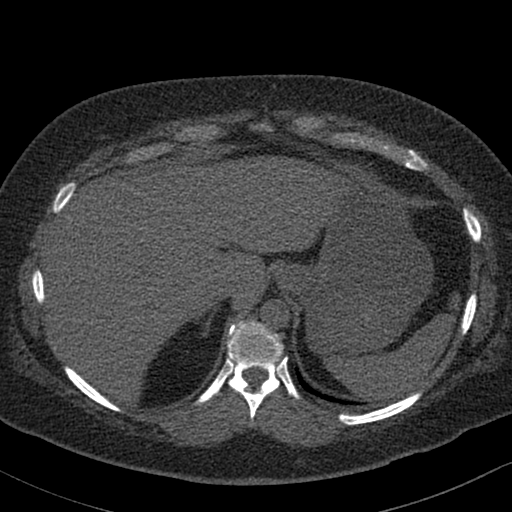
[im 14/69  lung]
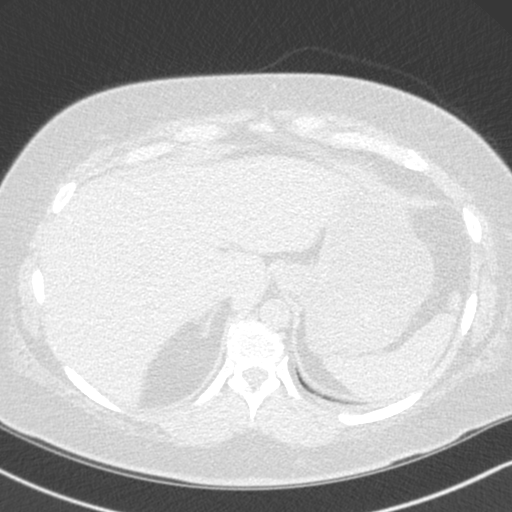
[im 28/69  vessel]
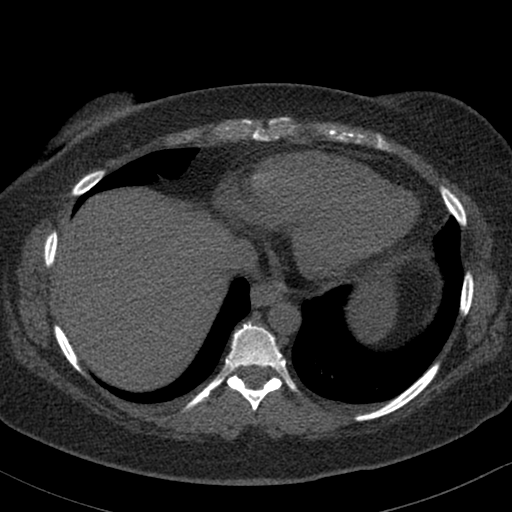
[im 41/69  vessel]
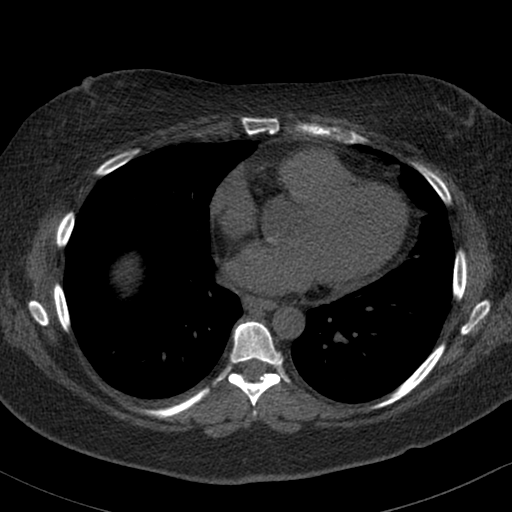
[im 55/69  vessel]
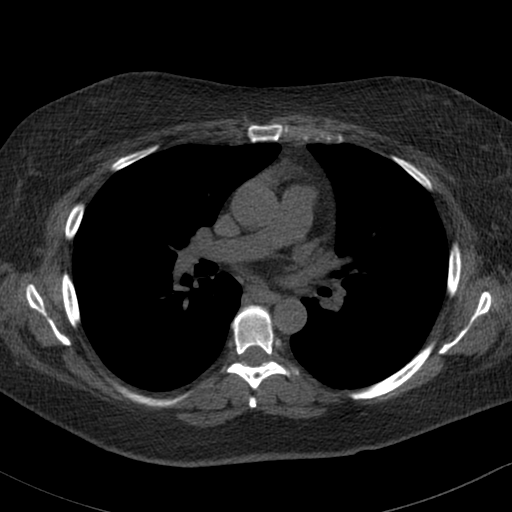

[12 of 20 positions shown; findings below may reference images not displayed]

FINDINGS: Cardiovascular: Mild aortic atherosclerosis. No other significant
extracardiac vascular findings.

Mediastinum/Nodes: There are no enlarged lymph nodes within the
visualized mediastinum.

Lungs/Pleura: There is no pleural effusion. Mild linear atelectasis
or scarring in both lungs. No suspicious pulmonary nodules.

Upper abdomen: No significant findings in the visualized upper
abdomen.

Musculoskeletal/Chest wall: No chest wall mass or suspicious osseous
findings within the visualized chest. Mild thoracic spondylosis.
IMPRESSION: No significant extracardiac findings in the visualized lower chest.
FINDINGS: Coronary arteries: Origins not well visualized.

Coronary Calcium Score:

Left main: 0

Left anterior descending artery: 0

Left circumflex artery: 0

Right coronary artery: 0

Total: 0

Percentile: 0

Pericardium: Normal.

Aorta: Normal caliber of ascending aorta. Aortic atherosclerosis
noted.

Non-cardiac: See separate report from [REDACTED].
IMPRESSION: Coronary calcium score of 0. This was 0 percentile for age-, race-,
and sex-matched controls. Aortic atherosclerosis.



If CAC=0, it is reasonable to withhold statin therapy and reassess
in 5 to 10 years, as long as higher risk conditions are absent
(diabetes mellitus, family history of premature CHD in first degree
relatives (males <55 years; females <65 years), cigarette smoking,
or LDL >=190 mg/dL).

If CAC is 1 to 99, it is reasonable to initiate statin therapy for
patients >=55 years of age.

If CAC is >=100 or >=75th percentile, it is reasonable to initiate
statin therapy at any age.

Cardiology referral should be considered for patients with CAC
scores >=400 or >=75th percentile.

*7049 AHA/ACC/AACVPR/AAPA/ABC/BASALDUA/ENE DDR/ASURADA/Lodh/MURTA/DREY/QUINONES
Guideline on the Management of Blood Cholesterol: A Report of the
American College of Cardiology/American Heart Association Task Force
on Clinical Practice Guidelines. J Am Coll Cardiol.
2890;73(24):6228-6104.

*** End of Addendum ***
EXAM:
OVER-READ INTERPRETATION  CT CHEST

The following report is an over-read performed by radiologist Dr.
Keyli Tiger [REDACTED] on 09/17/2021. This
over-read does not include interpretation of cardiac or coronary
anatomy or pathology. The coronary calcium score interpretation by
the cardiologist is attached.
FINDINGS: Cardiovascular: Mild aortic atherosclerosis. No other significant
extracardiac vascular findings.

Mediastinum/Nodes: There are no enlarged lymph nodes within the
visualized mediastinum.

Lungs/Pleura: There is no pleural effusion. Mild linear atelectasis
or scarring in both lungs. No suspicious pulmonary nodules.

Upper abdomen: No significant findings in the visualized upper
abdomen.

Musculoskeletal/Chest wall: No chest wall mass or suspicious osseous
findings within the visualized chest. Mild thoracic spondylosis.
IMPRESSION: No significant extracardiac findings in the visualized lower chest.
